# Patient Record
Sex: Male | Born: 1975 | Race: White | Hispanic: No | Marital: Single | Smoking: Current some day smoker
Health system: Southern US, Community
[De-identification: ages and names within clinical notes are randomized; demographics above are authoritative.]

## PROBLEM LIST (undated history)

## (undated) DIAGNOSIS — J449 Chronic obstructive pulmonary disease, unspecified: Secondary | ICD-10-CM

## (undated) DIAGNOSIS — I10 Essential (primary) hypertension: Secondary | ICD-10-CM

## (undated) DIAGNOSIS — I219 Acute myocardial infarction, unspecified: Secondary | ICD-10-CM

## (undated) HISTORY — DX: Chronic obstructive pulmonary disease, unspecified: J44.9

---

## 1999-02-19 ENCOUNTER — Emergency Department (HOSPITAL_COMMUNITY): Admission: EM | Admit: 1999-02-19 | Discharge: 1999-02-19 | Payer: Self-pay | Admitting: Emergency Medicine

## 1999-03-07 ENCOUNTER — Emergency Department (HOSPITAL_COMMUNITY): Admission: EM | Admit: 1999-03-07 | Discharge: 1999-03-07 | Payer: Self-pay | Admitting: Emergency Medicine

## 1999-06-26 ENCOUNTER — Emergency Department (HOSPITAL_COMMUNITY): Admission: EM | Admit: 1999-06-26 | Discharge: 1999-06-26 | Payer: Self-pay | Admitting: *Deleted

## 1999-08-03 ENCOUNTER — Emergency Department (HOSPITAL_COMMUNITY): Admission: EM | Admit: 1999-08-03 | Discharge: 1999-08-03 | Payer: Self-pay | Admitting: *Deleted

## 1999-10-17 ENCOUNTER — Emergency Department (HOSPITAL_COMMUNITY): Admission: EM | Admit: 1999-10-17 | Discharge: 1999-10-17 | Payer: Self-pay

## 1999-12-07 ENCOUNTER — Emergency Department (HOSPITAL_COMMUNITY): Admission: EM | Admit: 1999-12-07 | Discharge: 1999-12-07 | Payer: Self-pay | Admitting: *Deleted

## 2000-01-10 ENCOUNTER — Emergency Department (HOSPITAL_COMMUNITY): Admission: EM | Admit: 2000-01-10 | Discharge: 2000-01-10 | Payer: Self-pay | Admitting: Emergency Medicine

## 2000-08-12 ENCOUNTER — Emergency Department (HOSPITAL_COMMUNITY): Admission: EM | Admit: 2000-08-12 | Discharge: 2000-08-12 | Payer: Self-pay | Admitting: *Deleted

## 2000-11-14 ENCOUNTER — Emergency Department (HOSPITAL_COMMUNITY): Admission: EM | Admit: 2000-11-14 | Discharge: 2000-11-14 | Payer: Self-pay | Admitting: Internal Medicine

## 2001-01-26 ENCOUNTER — Emergency Department (HOSPITAL_COMMUNITY): Admission: EM | Admit: 2001-01-26 | Discharge: 2001-01-26 | Payer: Self-pay | Admitting: Emergency Medicine

## 2001-08-25 ENCOUNTER — Emergency Department (HOSPITAL_COMMUNITY): Admission: EM | Admit: 2001-08-25 | Discharge: 2001-08-25 | Payer: Self-pay | Admitting: Emergency Medicine

## 2001-11-17 ENCOUNTER — Emergency Department (HOSPITAL_COMMUNITY): Admission: EM | Admit: 2001-11-17 | Discharge: 2001-11-17 | Payer: Self-pay | Admitting: Emergency Medicine

## 2002-11-03 ENCOUNTER — Emergency Department (HOSPITAL_COMMUNITY): Admission: EM | Admit: 2002-11-03 | Discharge: 2002-11-03 | Payer: Self-pay | Admitting: Emergency Medicine

## 2002-11-03 ENCOUNTER — Encounter: Payer: Self-pay | Admitting: Emergency Medicine

## 2003-11-22 ENCOUNTER — Emergency Department (HOSPITAL_COMMUNITY): Admission: EM | Admit: 2003-11-22 | Discharge: 2003-11-22 | Payer: Self-pay | Admitting: Emergency Medicine

## 2005-08-19 ENCOUNTER — Emergency Department (HOSPITAL_COMMUNITY): Admission: EM | Admit: 2005-08-19 | Discharge: 2005-08-19 | Payer: Self-pay | Admitting: Emergency Medicine

## 2005-12-04 ENCOUNTER — Emergency Department (HOSPITAL_COMMUNITY): Admission: EM | Admit: 2005-12-04 | Discharge: 2005-12-04 | Payer: Self-pay | Admitting: Emergency Medicine

## 2006-12-28 ENCOUNTER — Emergency Department (HOSPITAL_COMMUNITY): Admission: EM | Admit: 2006-12-28 | Discharge: 2006-12-28 | Payer: Self-pay | Admitting: Emergency Medicine

## 2007-07-19 ENCOUNTER — Emergency Department (HOSPITAL_COMMUNITY): Admission: EM | Admit: 2007-07-19 | Discharge: 2007-07-19 | Payer: Self-pay | Admitting: Emergency Medicine

## 2007-07-23 ENCOUNTER — Emergency Department (HOSPITAL_COMMUNITY): Admission: EM | Admit: 2007-07-23 | Discharge: 2007-07-23 | Payer: Self-pay | Admitting: Emergency Medicine

## 2007-08-02 ENCOUNTER — Emergency Department (HOSPITAL_COMMUNITY): Admission: EM | Admit: 2007-08-02 | Discharge: 2007-08-02 | Payer: Self-pay | Admitting: Emergency Medicine

## 2008-07-26 ENCOUNTER — Emergency Department (HOSPITAL_COMMUNITY): Admission: EM | Admit: 2008-07-26 | Discharge: 2008-07-26 | Payer: Self-pay | Admitting: Emergency Medicine

## 2008-11-23 ENCOUNTER — Emergency Department (HOSPITAL_COMMUNITY): Admission: EM | Admit: 2008-11-23 | Discharge: 2008-11-23 | Payer: Self-pay | Admitting: Emergency Medicine

## 2009-09-15 ENCOUNTER — Emergency Department (HOSPITAL_COMMUNITY): Admission: EM | Admit: 2009-09-15 | Discharge: 2009-09-15 | Payer: Self-pay | Admitting: Emergency Medicine

## 2009-10-21 ENCOUNTER — Observation Stay (HOSPITAL_COMMUNITY): Admission: EM | Admit: 2009-10-21 | Discharge: 2009-10-21 | Payer: Self-pay | Admitting: Emergency Medicine

## 2010-09-08 LAB — POCT CARDIAC MARKERS
CKMB, poc: 1.4 ng/mL (ref 1.0–8.0)
CKMB, poc: <1 ng/mL — ABNORMAL LOW (ref 1.0–8.0)
Myoglobin, poc: 68.9 ng/mL (ref 12–200)
Troponin i, poc: <0.05 ng/mL (ref 0.00–0.09)

## 2010-09-08 LAB — BASIC METABOLIC PANEL
CO2: 24 mEq/L (ref 19–32)
Calcium: 8.5 mg/dL (ref 8.4–10.5)
Chloride: 108 mEq/L (ref 96–112)
GFR calc Af Amer: >60 mL/min (ref >60)
Sodium: 140 mEq/L (ref 135–145)

## 2010-09-08 LAB — DIFFERENTIAL
Basophils Relative: 0 % (ref 0–1)
Lymphs Abs: 3.8 10*3/uL (ref 0.7–4.0)
Monocytes Absolute: 0.4 10*3/uL (ref 0.1–1.0)
Monocytes Relative: 4 % (ref 3–12)
Neutro Abs: 6.4 10*3/uL (ref 1.7–7.7)

## 2010-09-08 LAB — CBC
Hemoglobin: 15.4 g/dL (ref 13.0–17.0)
MCHC: 34.2 g/dL (ref 30.0–36.0)
RBC: 4.74 MIL/uL (ref 4.22–5.81)
WBC: 10.8 10*3/uL — ABNORMAL HIGH (ref 4.0–10.5)

## 2010-12-31 ENCOUNTER — Emergency Department: Payer: Self-pay | Admitting: Emergency Medicine

## 2015-12-08 ENCOUNTER — Emergency Department
Admission: EM | Admit: 2015-12-08 | Discharge: 2015-12-08 | Disposition: A | Discharge status: Home / Self Care | Payer: Self-pay | Attending: Emergency Medicine | Admitting: Emergency Medicine

## 2015-12-08 ENCOUNTER — Encounter: Payer: Self-pay | Admitting: Emergency Medicine

## 2015-12-08 DIAGNOSIS — F17200 Nicotine dependence, unspecified, uncomplicated: Secondary | ICD-10-CM | POA: Insufficient documentation

## 2015-12-08 DIAGNOSIS — L723 Sebaceous cyst: Secondary | ICD-10-CM | POA: Insufficient documentation

## 2015-12-08 DIAGNOSIS — L089 Local infection of the skin and subcutaneous tissue, unspecified: Secondary | ICD-10-CM

## 2015-12-08 MED ORDER — SULFAMETHOXAZOLE-TRIMETHOPRIM 800-160 MG PO TABS: 1.0000 | ORAL_TABLET | Freq: Two times a day (BID) | ORAL | Status: DC

## 2015-12-08 NOTE — ED Notes (Signed)
See triage note  Developed some swelling behind left ear couple of days ago hx of abscess behind same ear.

## 2015-12-08 NOTE — ED Provider Notes (Signed)
[facility] Emergency Department Provider Note  ____________________________________________  Time seen: Approximately 8:39 AM  I have reviewed the triage vital signs and the nursing notes.   HISTORY  Chief Complaint Abscess    HPI Timothy Myers is a 40 y.o. male, NAD, presents to the emergency department with complaint of a nodule on the left neck. He first noticed a lesion in this area 2-3 years ago but states in the last two days it has increased in size and became sore last night. Describes pain as non-radiating soreness and rates at 1.5/10. Denies redness, warmth, fevers, nausea and vomiting. Alleviated pain with Goody's powder. Pain increases with palpation and movement of jaw. He reports a similar lesion behind his ear several years ago that required incision and drainage.    History reviewed. No pertinent past medical history.  There are no active problems to display for this patient.   History reviewed. No pertinent past surgical history.  Current Outpatient Rx  Name  Route  Sig  Dispense  Refill  . sulfamethoxazole-trimethoprim (BACTRIM DS,SEPTRA DS) 800-160 MG tablet   Oral   Take 1 tablet by mouth 2 (two) times daily.   14 tablet   0     Allergies Review of patient's allergies indicates no known allergies.  No family history on file.  Social History Social History  Substance Use Topics  . Smoking status: Current Some Day Smoker  . Smokeless tobacco: None  . Alcohol Use: No     Review of Systems  Constitutional: No fever/chills ENT: No sore throat, swelling of neck, difficulty swallowing. Cardiovascular: No chest pain. Respiratory: No cough. No shortness of breath. No wheezing.  Gastrointestinal: No abdominal pain.  No nausea, vomiting.  Musculoskeletal: Negative for neck pain.  Skin: Positive skin sore left neck and behind ears. Negative for rash, redness, oozing, weeping. Neurological: Negative for headaches, focal  weakness or numbness. No tingling.  10-point ROS otherwise negative.  ____________________________________________   PHYSICAL EXAM:  VITAL SIGNS: ED Triage Vitals  Enc Vitals Group     BP 12/08/15 0820 155/98 mmHg     Pulse Rate 12/08/15 0820 88     Resp 12/08/15 0820 18     Temp 12/08/15 0820 98 F (36.7 C)     Temp Source 12/08/15 0820 Oral     SpO2 12/08/15 0820 98 %     Weight 12/08/15 0820 200 lb (90.719 kg)     Height 12/08/15 0820 6\' 4"  (1.93 m)     Head Cir --      Peak Flow --      Pain Score --      Pain Loc --      Pain Edu? --      Excl. in GC? --      Constitutional: Alert and oriented. Well appearing and in no acute distress. Eyes: Conjunctivae are normal.  Head: Atraumatic. ENT:      Nose: No congestion/rhinnorhea.      Mouth/Throat: Mucous membranes are moist.  Neck: Supple with FROM.   Hematological/Lymphatic/Immunilogical: No cervical lymphadenopathy. Cardiovascular: Good peripheral circulation. Respiratory: Normal respiratory effort without tachypnea or retractions. Neurologic:  Normal speech and language. No gross focal neurologic deficits are appreciated.  Skin:  1.0x1.0cm subcutaneous nodule on L neck beneath ear without erythema or warmth. Ttenderness to palpation of nodule. No drainage or weeping of lesion. Surrounding skin is non-erythematous and intact. Multiple white pustules noted behind the ear that are firm and without tenderness to  palpation. Skin is warm, dry and intact. No rash noted. Psychiatric: Mood and affect are normal. Speech and behavior are normal. Patient exhibits appropriate insight and judgement.   ____________________________________________   LABS  None ____________________________________________  EKG  None ____________________________________________  RADIOLOGY  None ____________________________________________    PROCEDURES  Procedure(s) performed: None    Medications - No data to  display   ____________________________________________   INITIAL IMPRESSION / ASSESSMENT AND PLAN / ED COURSE  Patient's diagnosis is consistent with infected sebaceous cyst. Patient will be discharged home with prescriptions for Bactrim DS to take as directed. Advised warm compresses to the affected areas 20 minutes 3-4 times daily. May take over-the-counter Tylenol or ibuprofen as needed for pain. Patient is to follow up with dermatology if symptoms persist past this treatment course. Patient is given ED precautions to return to the ED for any worsening or new symptoms.    ____________________________________________  FINAL CLINICAL IMPRESSION(S) / ED DIAGNOSES  Final diagnoses:  Infected sebaceous cyst      NEW MEDICATIONS STARTED DURING THIS VISIT:  New Prescriptions   SULFAMETHOXAZOLE-TRIMETHOPRIM (BACTRIM DS,SEPTRA DS) 800-160 MG TABLET    Take 1 tablet by mouth 2 (two) times daily.        Hope Pigeon, PA-C 12/08/15 0858  Myrna Blazer, MD 12/08/15 1540

## 2015-12-08 NOTE — ED Notes (Signed)
Pt noted a nodule to the right side of neck. possilble abscess or swollen gland, states has been there for one year. Denies any other sx at this time.

## 2015-12-08 NOTE — Discharge Instructions (Signed)
Epidermal Cyst An epidermal cyst is sometimes called a sebaceous cyst, epidermal inclusion cyst, or infundibular cyst. These cysts usually contain a substance that looks "pasty" or "cheesy" and may have a bad smell. This substance is a protein called keratin. Epidermal cysts are usually found on the face, neck, or trunk. They may also occur in the vaginal area or other parts of the genitalia of both men and women. Epidermal cysts are usually small, painless, slow-growing bumps or lumps that move freely under the skin. It is important not to try to pop them. This may cause an infection and lead to tenderness and swelling. CAUSES  Epidermal cysts may be caused by a deep penetrating injury to the skin or a plugged hair follicle, often associated with acne. SYMPTOMS  Epidermal cysts can become inflamed and cause:  Redness.  Tenderness.  Increased temperature of the skin over the bumps or lumps.  Grayish-white, bad smelling material that drains from the bump or lump. DIAGNOSIS  Epidermal cysts are easily diagnosed by your caregiver during an exam. Rarely, a tissue sample (biopsy) may be taken to rule out other conditions that may resemble epidermal cysts. TREATMENT   Epidermal cysts often get better and disappear on their own. They are rarely ever cancerous.  If a cyst becomes infected, it may become inflamed and tender. This may require opening and draining the cyst. Treatment with antibiotics may be necessary. When the infection is gone, the cyst may be removed with minor surgery.  Small, inflamed cysts can often be treated with antibiotics or by injecting steroid medicines.  Sometimes, epidermal cysts become large and bothersome. If this happens, surgical removal in your caregiver's office may be necessary. HOME CARE INSTRUCTIONS  Only take over-the-counter or prescription medicines as directed by your caregiver.  Take your antibiotics as directed. Finish them even if you start to feel  better. SEEK MEDICAL CARE IF:   Your cyst becomes tender, red, or swollen.  Your condition is not improving or is getting worse.  You have any other questions or concerns. MAKE SURE YOU:  Understand these instructions.  Will watch your condition.  Will get help right away if you are not doing well or get worse.   This information is not intended to replace advice given to you by your health care provider. Make sure you discuss any questions you have with your health care provider.   Document Released: 05/08/2004 Document Revised: 08/30/2011 Document Reviewed: 12/14/2010 Elsevier Interactive Patient Education 2016 Elsevier Inc.  

## 2015-12-10 ENCOUNTER — Encounter (HOSPITAL_COMMUNITY): Payer: Self-pay | Admitting: Emergency Medicine

## 2015-12-10 ENCOUNTER — Emergency Department (HOSPITAL_COMMUNITY)
Admission: EM | Admit: 2015-12-10 | Discharge: 2015-12-10 | Disposition: A | Discharge status: Home / Self Care | Payer: Self-pay | Attending: Emergency Medicine | Admitting: Emergency Medicine

## 2015-12-10 DIAGNOSIS — H6002 Abscess of left external ear: Secondary | ICD-10-CM | POA: Insufficient documentation

## 2015-12-10 DIAGNOSIS — I10 Essential (primary) hypertension: Secondary | ICD-10-CM | POA: Insufficient documentation

## 2015-12-10 DIAGNOSIS — F17200 Nicotine dependence, unspecified, uncomplicated: Secondary | ICD-10-CM | POA: Insufficient documentation

## 2015-12-10 HISTORY — DX: Essential (primary) hypertension: I10

## 2015-12-10 MED ORDER — LIDOCAINE HCL 2 % IJ SOLN
5.0000 mL | Freq: Once | INTRAMUSCULAR | Status: AC
  Administered 2015-12-10: 100 mg via INTRADERMAL
  Filled 2015-12-10: qty 20

## 2015-12-10 NOTE — Discharge Instructions (Signed)
Warm compresses to the area. Follow up as needed.    Abscess An abscess is an infected area that contains a collection of pus and debris.It can occur in almost any part of the body. An abscess is also known as a furuncle or boil. CAUSES  An abscess occurs when tissue gets infected. This can occur from blockage of oil or sweat glands, infection of hair follicles, or a minor injury to the skin. As the body tries to fight the infection, pus collects in the area and creates pressure under the skin. This pressure causes pain. People with weakened immune systems have difficulty fighting infections and get certain abscesses more often.  SYMPTOMS Usually an abscess develops on the skin and becomes a painful mass that is red, warm, and tender. If the abscess forms under the skin, you may feel a moveable soft area under the skin. Some abscesses break open (rupture) on their own, but most will continue to get worse without care. The infection can spread deeper into the body and eventually into the bloodstream, causing you to feel ill.  DIAGNOSIS  Your caregiver will take your medical history and perform a physical exam. A sample of fluid may also be taken from the abscess to determine what is causing your infection. TREATMENT  Your caregiver may prescribe antibiotic medicines to fight the infection. However, taking antibiotics alone usually does not cure an abscess. Your caregiver may need to make a small cut (incision) in the abscess to drain the pus. In some cases, gauze is packed into the abscess to reduce pain and to continue draining the area. HOME CARE INSTRUCTIONS   Only take over-the-counter or prescription medicines for pain, discomfort, or fever as directed by your caregiver.  If you were prescribed antibiotics, take them as directed. Finish them even if you start to feel better.  If gauze is used, follow your caregiver's directions for changing the gauze.  To avoid spreading the  infection:  Keep your draining abscess covered with a bandage.  Wash your hands well.  Do not share personal care items, towels, or whirlpools with others.  Avoid skin contact with others.  Keep your skin and clothes clean around the abscess.  Keep all follow-up appointments as directed by your caregiver. SEEK MEDICAL CARE IF:   You have increased pain, swelling, redness, fluid drainage, or bleeding.  You have muscle aches, chills, or a general ill feeling.  You have a fever. MAKE SURE YOU:   Understand these instructions.  Will watch your condition.  Will get help right away if you are not doing well or get worse.   This information is not intended to replace advice given to you by your health care provider. Make sure you discuss any questions you have with your health care provider.   Document Released: 03/17/2005 Document Revised: 12/07/2011 Document Reviewed: 08/20/2011 Elsevier Interactive Patient Education [employer] Elsevier Inc.

## 2015-12-10 NOTE — ED Notes (Addendum)
PA at bedside to I & D patient abscess.

## 2015-12-10 NOTE — ED Provider Notes (Signed)
CSN: [id number]     Arrival date & time 12/10/15  1318 History  By signing my name below, I, Rosario Adie, attest that this documentation has been prepared under the direction and in the presence of Kalden Wanke, PA-C.   Electronically Signed: Rosario Adie, ED Scribe. 12/10/2015. 2:26 PM.   Chief Complaint  Patient presents with  . Abscess   The history is provided by the patient. No language interpreter was used.   HPI Comments: Timothy Myers is a 40 y.o. male who presents to the Emergency Department complaining of a gradual onset, gradually worsening, constant abscess behind his left ear onset 4 days PTA. Pt was seen in the ED 2 days ago for his symptoms, and was prescribed Bactrim BID, which he has been complaint in taking, and has had no relief of his symptoms. He has associated pain that is worsened by palpation and jaw movement. He has a hx of a similar abscess to the same area which were resolved with I&D several years ago. Pt denies fever. He is not complaining of any other symptoms at this time.   Past Medical History  Diagnosis Date  . Hypertension    History reviewed. No pertinent past surgical history. No family history on file. Social History  Substance Use Topics  . Smoking status: Current Some Day Smoker  . Smokeless tobacco: None  . Alcohol Use: No    Review of Systems  Constitutional: Negative for fever.  Skin:       +abcess behind left ear   Allergies  Review of patient's allergies indicates no known allergies.  Home Medications   Prior to Admission medications   Medication Sig Start Date End Date Taking? Authorizing Provider  sulfamethoxazole-trimethoprim (BACTRIM DS,SEPTRA DS) 800-160 MG tablet Take 1 tablet by mouth 2 (two) times daily. 12/08/15   Jami L Hagler, PA-C   BP 157/98 mmHg  Pulse 84  Temp(Src) 98 F (36.7 C) (Oral)  Resp 16  Ht  (1.905 m)  Wt 200 lb (90.719 kg)  BMI 25.00 kg/m2  SpO2 96%   Physical Exam   Constitutional: He appears well-developed and well-nourished.  HENT:  Head: Normocephalic.  Eyes: Conjunctivae are normal.  Cardiovascular: Normal rate.   Pulmonary/Chest: Effort normal. No respiratory distress.  Abdominal: He exhibits no distension.  Musculoskeletal: Normal range of motion.  Neurological: He is alert.  Skin: Skin is warm and dry.  3 x 1 cm abscess just posterior to the left ear. Tender to palpation. Erythematous. Fluctuant.  Psychiatric: He has a normal mood and affect. His behavior is normal.  Nursing note and vitals reviewed.  ED Course  Procedures (including critical care time)  DIAGNOSTIC STUDIES: Oxygen Saturation is 96% on RA, normal by my interpretation.   COORDINATION OF CARE: 2:13 PM-Discussed next steps with pt including I&D. Pt verbalized understanding and is agreeable with the plan.   INCISION AND DRAINAGE PROCEDURE NOTE: Patient identification was confirmed and verbal consent was obtained. This procedure was performed by Jaynie Crumble, PA-C at 2:34 PM. Site: posterior left ear Sterile procedures observed Needle size: 25  Anesthetic used (type and amt): 2% lidocaine, 1cc Blade size: 11 Drainage: large, purulent  Complexity: Complex Packing was not used Site anesthetized, incision made over site, wound drained and explored loculations, rinsed with copious amounts of normal saline, wound packed with sterile gauze, covered with dry, sterile dressing.  Pt tolerated procedure well without complications.  Instructions for care discussed verbally and pt provided with  additional written instructions for homecare and f/u.  MDM   Final diagnoses:  Abscess of left external ear   Patient with a skin abscess to the posterior left ear. Incised and drained in emergency department with large purulent drainage. Patient is currently on antibiotics, will continue those, follow-up as needed. Wound care at home. Patient is otherwise nontoxic  appearing.  Filed Vitals:   12/10/15 1330 12/10/15 1331  BP:  157/98  Pulse: 84   Temp: 98 F (36.7 C)   TempSrc: Oral   Resp: 16   Height: 6\' 3"  (1.905 m)   Weight: 90.719 kg   SpO2: 96%     I personally performed the services described in this documentation, which was scribed in my presence. The recorded information has been reviewed and is accurate.    Jaynie Crumbleatyana Vonnetta Akey, PA-C 12/10/15 1618  Rolland PorterMark James, MD 12/18/15 [phone]

## 2015-12-10 NOTE — ED Notes (Signed)
Pt has abscess behind left ear with swelling under left ear. Pt has been taking bactrim since Monday with no help.

## 2017-01-21 ENCOUNTER — Inpatient Hospital Stay
Admission: EM | Admit: 2017-01-21 | Discharge: 2017-01-23 | DRG: 247 | Disposition: A | Discharge status: Home / Self Care | Payer: Self-pay | Attending: Internal Medicine | Admitting: Internal Medicine

## 2017-01-21 ENCOUNTER — Encounter: Payer: Self-pay | Admitting: Emergency Medicine

## 2017-01-21 ENCOUNTER — Encounter
Admission: EM | Disposition: A | Discharge status: Home / Self Care | Payer: Self-pay | Source: Home / Self Care | Attending: Internal Medicine

## 2017-01-21 DIAGNOSIS — I2119 ST elevation (STEMI) myocardial infarction involving other coronary artery of inferior wall: Principal | ICD-10-CM | POA: Diagnosis present

## 2017-01-21 DIAGNOSIS — I2111 ST elevation (STEMI) myocardial infarction involving right coronary artery: Secondary | ICD-10-CM | POA: Diagnosis present

## 2017-01-21 DIAGNOSIS — I249 Acute ischemic heart disease, unspecified: Secondary | ICD-10-CM

## 2017-01-21 DIAGNOSIS — F17210 Nicotine dependence, cigarettes, uncomplicated: Secondary | ICD-10-CM | POA: Diagnosis present

## 2017-01-21 DIAGNOSIS — E876 Hypokalemia: Secondary | ICD-10-CM | POA: Diagnosis present

## 2017-01-21 DIAGNOSIS — Z880 Allergy status to penicillin: Secondary | ICD-10-CM

## 2017-01-21 DIAGNOSIS — F1410 Cocaine abuse, uncomplicated: Secondary | ICD-10-CM | POA: Diagnosis present

## 2017-01-21 DIAGNOSIS — I213 ST elevation (STEMI) myocardial infarction of unspecified site: Secondary | ICD-10-CM

## 2017-01-21 DIAGNOSIS — E785 Hyperlipidemia, unspecified: Secondary | ICD-10-CM | POA: Diagnosis present

## 2017-01-21 DIAGNOSIS — F1110 Opioid abuse, uncomplicated: Secondary | ICD-10-CM | POA: Diagnosis present

## 2017-01-21 DIAGNOSIS — I2121 ST elevation (STEMI) myocardial infarction involving left circumflex coronary artery: Secondary | ICD-10-CM | POA: Diagnosis present

## 2017-01-21 DIAGNOSIS — Z8249 Family history of ischemic heart disease and other diseases of the circulatory system: Secondary | ICD-10-CM

## 2017-01-21 DIAGNOSIS — I10 Essential (primary) hypertension: Secondary | ICD-10-CM | POA: Diagnosis present

## 2017-01-21 DIAGNOSIS — F1210 Cannabis abuse, uncomplicated: Secondary | ICD-10-CM | POA: Diagnosis present

## 2017-01-21 HISTORY — PX: CORONARY/GRAFT ACUTE MI REVASCULARIZATION: CATH118305

## 2017-01-21 HISTORY — PX: LEFT HEART CATH AND CORONARY ANGIOGRAPHY: CATH118249

## 2017-01-21 LAB — COMPREHENSIVE METABOLIC PANEL
ALT: 17 U/L (ref 17–63)
AST: 29 U/L (ref 15–41)
Albumin: 4 g/dL (ref 3.5–5.0)
Alkaline Phosphatase: 115 U/L (ref 38–126)
Anion gap: 13 (ref 5–15)
BUN: 17 mg/dL (ref 6–20)
CO2: 24 mmol/L (ref 22–32)
Calcium: 9.5 mg/dL (ref 8.9–10.3)
Chloride: 103 mmol/L (ref 101–111)
Creatinine, Ser: 0.97 mg/dL (ref 0.61–1.24)
GFR calc Af Amer: >60 mL/min (ref >60)
Glucose, Bld: 125 mg/dL — ABNORMAL HIGH (ref 65–99)
POTASSIUM: 3.3 mmol/L — AB (ref 3.5–5.1)
Sodium: 140 mmol/L (ref 135–145)
Total Bilirubin: 0.7 mg/dL (ref 0.3–1.2)
Total Protein: 7.6 g/dL (ref 6.5–8.1)

## 2017-01-21 LAB — BASIC METABOLIC PANEL WITH GFR
Anion gap: 8 (ref 5–15)
BUN: 12 mg/dL (ref 6–20)
CO2: 23 mmol/L (ref 22–32)
Calcium: 8.9 mg/dL (ref 8.9–10.3)
Chloride: 107 mmol/L (ref 101–111)
Creatinine, Ser: 0.87 mg/dL (ref 0.61–1.24)
GFR calc Af Amer: >60 mL/min
GFR calc non Af Amer: >60 mL/min
Glucose, Bld: 122 mg/dL — ABNORMAL HIGH (ref 65–99)
Potassium: 3.9 mmol/L (ref 3.5–5.1)
Sodium: 138 mmol/L (ref 135–145)

## 2017-01-21 LAB — TROPONIN I: TROPONIN I: 10.38 ng/mL — AB (ref <0.03)

## 2017-01-21 LAB — CBC WITH DIFFERENTIAL/PLATELET
BASOS ABS: 0.2 10*3/uL — AB (ref 0–0.1)
BASOS PCT: 1 %
Eosinophils Absolute: 0.6 10*3/uL (ref 0–0.7)
Eosinophils Relative: 4 %
HEMATOCRIT: 46.9 % (ref 40.0–52.0)
HEMOGLOBIN: 16.3 g/dL (ref 13.0–18.0)
Lymphocytes Relative: 27 %
Lymphs Abs: 4 10*3/uL — ABNORMAL HIGH (ref 1.0–3.6)
MCH: 31.4 pg (ref 26.0–34.0)
MCHC: 34.7 g/dL (ref 32.0–36.0)
MCV: 90.3 fL (ref 80.0–100.0)
MONO ABS: 1.3 10*3/uL — AB (ref 0.2–1.0)
Monocytes Relative: 9 %
NEUTROS ABS: 9 10*3/uL — AB (ref 1.4–6.5)
NEUTROS PCT: 59 %
Platelets: 239 10*3/uL (ref 150–440)
RBC: 5.19 MIL/uL (ref 4.40–5.90)
RDW: 14.1 % (ref 11.5–14.5)
WBC: 15.1 10*3/uL — ABNORMAL HIGH (ref 3.8–10.6)

## 2017-01-21 LAB — LIPID PANEL
CHOL/HDL RATIO: 7.4 ratio
CHOLESTEROL: 223 mg/dL — AB (ref 0–200)
HDL: 30 mg/dL — ABNORMAL LOW (ref >40)
LDL Cholesterol: 151 mg/dL — ABNORMAL HIGH (ref 0–99)
Triglycerides: 208 mg/dL — ABNORMAL HIGH (ref <150)
VLDL: 42 mg/dL — ABNORMAL HIGH (ref 0–40)

## 2017-01-21 LAB — PROTIME-INR
INR: 1.01
Prothrombin Time: 13.3 seconds (ref 11.4–15.2)

## 2017-01-21 LAB — PHOSPHORUS: Phosphorus: 3.6 mg/dL (ref 2.5–4.6)

## 2017-01-21 LAB — MAGNESIUM: Magnesium: 1.9 mg/dL (ref 1.7–2.4)

## 2017-01-21 LAB — CARDIAC CATHETERIZATION: CATHEFQUANT: 50 %

## 2017-01-21 LAB — APTT: aPTT: 33 seconds (ref 24–36)

## 2017-01-21 LAB — GLUCOSE, CAPILLARY: GLUCOSE-CAPILLARY: 134 mg/dL — AB (ref 65–99)

## 2017-01-21 LAB — MRSA PCR SCREENING: MRSA by PCR: NEGATIVE

## 2017-01-21 LAB — POCT ACTIVATED CLOTTING TIME: Activated Clotting Time: 318 seconds

## 2017-01-21 SURGERY — LEFT HEART CATH AND CORONARY ANGIOGRAPHY
Anesthesia: Moderate Sedation

## 2017-01-21 MED ORDER — METOPROLOL TARTRATE 25 MG PO TABS
25.0000 mg | ORAL_TABLET | Freq: Two times a day (BID) | ORAL | Status: DC
Start: 2017-01-21 — End: 2017-01-21
  Administered 2017-01-21: 25 mg via ORAL
  Filled 2017-01-21: qty 1

## 2017-01-21 MED ORDER — ONDANSETRON HCL 4 MG/2ML IJ SOLN
INTRAMUSCULAR | Status: AC
  Administered 2017-01-21: 4 mg
  Filled 2017-01-21: qty 2

## 2017-01-21 MED ORDER — LIDOCAINE HCL (PF) 1 % IJ SOLN
INTRAMUSCULAR | Status: AC
Start: 2017-01-21 — End: 2017-01-21
  Filled 2017-01-21: qty 30

## 2017-01-21 MED ORDER — TICAGRELOR 90 MG PO TABS
90.0000 mg | ORAL_TABLET | Freq: Two times a day (BID) | ORAL | Status: DC
  Administered 2017-01-21 – 2017-01-23 (×4): 90 mg via ORAL
  Filled 2017-01-21 (×5): qty 1

## 2017-01-21 MED ORDER — BIVALIRUDIN TRIFLUOROACETATE 250 MG IV SOLR
INTRAVENOUS | Status: AC
  Filled 2017-01-21: qty 250

## 2017-01-21 MED ORDER — SODIUM CHLORIDE 0.9 % IV SOLN: 0.2500 mg/kg/h | INTRAVENOUS | Status: AC

## 2017-01-21 MED ORDER — MORPHINE SULFATE (PF) 4 MG/ML IV SOLN
INTRAVENOUS | Status: AC
  Administered 2017-01-21: 4 mg
  Filled 2017-01-21: qty 1

## 2017-01-21 MED ORDER — ASPIRIN 81 MG PO CHEW
81.0000 mg | CHEWABLE_TABLET | Freq: Every day | ORAL | Status: DC
  Administered 2017-01-22 – 2017-01-23 (×2): 81 mg via ORAL
  Filled 2017-01-21 (×2): qty 1

## 2017-01-21 MED ORDER — ONDANSETRON HCL 4 MG/2ML IJ SOLN
INTRAMUSCULAR | Status: AC
  Filled 2017-01-21: qty 2

## 2017-01-21 MED ORDER — SODIUM CHLORIDE 0.9 % IV SOLN: 250.0000 mL | INTRAVENOUS | Status: DC | PRN

## 2017-01-21 MED ORDER — SODIUM CHLORIDE 0.9 % IV SOLN
INTRAVENOUS | Status: AC | PRN
  Administered 2017-01-21: 1.75 mg/kg/h via INTRAVENOUS

## 2017-01-21 MED ORDER — SODIUM CHLORIDE 0.9 % IV SOLN
INTRAVENOUS | Status: AC | PRN
  Administered 2017-01-21: 0.25 mg/kg/h via INTRAVENOUS

## 2017-01-21 MED ORDER — TICAGRELOR 90 MG PO TABS
ORAL_TABLET | ORAL | Status: AC
  Filled 2017-01-21: qty 1

## 2017-01-21 MED ORDER — MORPHINE SULFATE (PF) 2 MG/ML IV SOLN: 2.0000 mg | INTRAVENOUS | Status: DC | PRN

## 2017-01-21 MED ORDER — IOPAMIDOL (ISOVUE-300) INJECTION 61%
INTRAVENOUS | Status: DC | PRN
  Administered 2017-01-21: 230 mL via INTRA_ARTERIAL

## 2017-01-21 MED ORDER — HYDRALAZINE HCL 20 MG/ML IJ SOLN: 5.0000 mg | INTRAMUSCULAR | Status: AC | PRN

## 2017-01-21 MED ORDER — METOPROLOL TARTRATE 25 MG PO TABS
25.0000 mg | ORAL_TABLET | Freq: Three times a day (TID) | ORAL | Status: DC
Start: 2017-01-21 — End: 2017-01-23
  Administered 2017-01-22 – 2017-01-23 (×4): 25 mg via ORAL
  Filled 2017-01-21 (×4): qty 1

## 2017-01-21 MED ORDER — SODIUM CHLORIDE 0.9 % WEIGHT BASED INFUSION
1.0000 mL/kg/h | INTRAVENOUS | Status: DC
  Administered 2017-01-21 (×2): 1 mL/kg/h via INTRAVENOUS

## 2017-01-21 MED ORDER — LABETALOL HCL 5 MG/ML IV SOLN: 10.0000 mg | INTRAVENOUS | Status: AC | PRN

## 2017-01-21 MED ORDER — NITROGLYCERIN 1 MG/10 ML FOR IR/CATH LAB
INTRA_ARTERIAL | Status: DC | PRN
  Administered 2017-01-21: 200 ug

## 2017-01-21 MED ORDER — ATORVASTATIN CALCIUM 20 MG PO TABS
80.0000 mg | ORAL_TABLET | Freq: Every day | ORAL | Status: DC
  Administered 2017-01-21 – 2017-01-22 (×2): 80 mg via ORAL
  Filled 2017-01-21 (×2): qty 4

## 2017-01-21 MED ORDER — NITROGLYCERIN 5 MG/ML IV SOLN
INTRAVENOUS | Status: AC
  Filled 2017-01-21: qty 10

## 2017-01-21 MED ORDER — TICAGRELOR 90 MG PO TABS
ORAL_TABLET | ORAL | Status: DC | PRN
  Administered 2017-01-21: 180 mg via ORAL

## 2017-01-21 MED ORDER — OXYCODONE-ACETAMINOPHEN 5-325 MG PO TABS
1.0000 | ORAL_TABLET | ORAL | Status: DC | PRN
  Administered 2017-01-21 – 2017-01-22 (×2): 2 via ORAL
  Filled 2017-01-21 (×2): qty 2

## 2017-01-21 MED ORDER — MIDAZOLAM HCL 2 MG/2ML IJ SOLN
INTRAMUSCULAR | Status: AC
  Filled 2017-01-21: qty 2

## 2017-01-21 MED ORDER — FENTANYL CITRATE (PF) 100 MCG/2ML IJ SOLN
INTRAMUSCULAR | Status: DC | PRN
  Administered 2017-01-21 (×2): 50 ug via INTRAVENOUS

## 2017-01-21 MED ORDER — ACETAMINOPHEN 325 MG PO TABS
650.0000 mg | ORAL_TABLET | ORAL | Status: DC | PRN
  Administered 2017-01-21: 650 mg via ORAL
  Filled 2017-01-21: qty 2

## 2017-01-21 MED ORDER — HEPARIN SODIUM (PORCINE) 5000 UNIT/ML IJ SOLN
4000.0000 [IU] | Freq: Once | INTRAMUSCULAR | Status: AC
  Administered 2017-01-21: 4000 [IU] via INTRAVENOUS

## 2017-01-21 MED ORDER — BIVALIRUDIN BOLUS VIA INFUSION - CUPID
INTRAVENOUS | Status: DC | PRN
  Administered 2017-01-21: 72 mg via INTRAVENOUS

## 2017-01-21 MED ORDER — SODIUM CHLORIDE 0.9% FLUSH
3.0000 mL | Freq: Two times a day (BID) | INTRAVENOUS | Status: DC
  Administered 2017-01-22 (×3): 3 mL via INTRAVENOUS

## 2017-01-21 MED ORDER — NICOTINE 21 MG/24HR TD PT24
21.0000 mg | MEDICATED_PATCH | Freq: Every day | TRANSDERMAL | Status: DC
  Administered 2017-01-21: 21 mg via TRANSDERMAL
  Filled 2017-01-21: qty 1

## 2017-01-21 MED ORDER — SODIUM CHLORIDE 0.9% FLUSH: 3.0000 mL | INTRAVENOUS | Status: DC | PRN

## 2017-01-21 MED ORDER — LISINOPRIL 5 MG PO TABS
2.5000 mg | ORAL_TABLET | Freq: Every day | ORAL | Status: DC
  Administered 2017-01-21 – 2017-01-23 (×3): 2.5 mg via ORAL
  Filled 2017-01-21 (×3): qty 1

## 2017-01-21 MED ORDER — ONDANSETRON HCL 4 MG/2ML IJ SOLN
4.0000 mg | Freq: Four times a day (QID) | INTRAMUSCULAR | Status: DC | PRN
  Administered 2017-01-22: 4 mg via INTRAVENOUS
  Filled 2017-01-21: qty 2

## 2017-01-21 MED ORDER — FENTANYL CITRATE (PF) 100 MCG/2ML IJ SOLN
INTRAMUSCULAR | Status: AC
  Filled 2017-01-21: qty 2

## 2017-01-21 MED ORDER — ASPIRIN 81 MG PO CHEW: 324.0000 mg | CHEWABLE_TABLET | Freq: Once | ORAL | Status: DC

## 2017-01-21 MED ORDER — MIDAZOLAM HCL 2 MG/2ML IJ SOLN
INTRAMUSCULAR | Status: DC | PRN
  Administered 2017-01-21 (×2): 1 mg via INTRAVENOUS

## 2017-01-21 SURGICAL SUPPLY — 18 items
BALLN TREK RX 3.0X20 (BALLOONS) ×3
BALLOON TREK RX 3.0X20 (BALLOONS) IMPLANT
CATH 5FR JL4 DIAGNOSTIC (CATHETERS) ×2 IMPLANT
CATH INFINITI 5FR ANG PIGTAIL (CATHETERS) ×2 IMPLANT
CATH VISTA GUIDE 6FR JR4 (CATHETERS) ×2 IMPLANT
DEVICE CLOSURE MYNXGRIP 6/7F (Vascular Products) ×2 IMPLANT
DEVICE INFLAT 30 PLUS (MISCELLANEOUS) ×2 IMPLANT
KIT MANI 3VAL PERCEP (MISCELLANEOUS) ×3 IMPLANT
NDL PERC 18GX7CM (NEEDLE) IMPLANT
NEEDLE PERC 18GX7CM (NEEDLE) ×3 IMPLANT
PACK CARDIAC CATH (CUSTOM PROCEDURE TRAY) ×3 IMPLANT
SHEATH AVANTI 6FR X 11CM (SHEATH) ×2 IMPLANT
STENT XIENCE ALPINE RX 3.5X28 (Permanent Stent) ×2 IMPLANT
WIRE ASAHI GRAND SLAM 180CM (WIRE) ×3 IMPLANT
WIRE EMERALD 3MM-J .035X150CM (WIRE) ×2 IMPLANT
WIRE G HI TQ BMW 190 (WIRE) ×2 IMPLANT
WIRE HI TORQ WHISPER MS 190CM (WIRE) ×4 IMPLANT
WIRE INTUITION PROPEL ST 180CM (WIRE) ×2 IMPLANT

## 2017-01-21 NOTE — Progress Notes (Signed)
CH responded to Code Stemi for pt in ED02. Upon arrival, pt had not arrived. Pt arrived a few minutes later. Pt was alert and talking. Dc's and nurses examined the pt before moving him to Cath Lab for procedure. No family member was present. Pt indicated he was going to call his family. Champlain to follow up with pt as needed to provide moral support and spiritual care.    01/21/17 1400  Clinical Encounter Type  Visited With Patient;Health care provider  Referral From Nurse  Consult/Referral To Chaplain  Spiritual Encounters  Spiritual Needs Other (Comment)

## 2017-01-21 NOTE — ED Provider Notes (Signed)
[facility] Emergency Department Provider Note  ____________________________________________  Time seen: Approximately 1:11 PM  I have reviewed the triage vital signs and the nursing notes.   HISTORY  Chief Complaint Code STEMI   HPI Timothy Myers is a 41 y.o. male a history of hypertension and smoking who presents for evaluation of chest pain. Patient reports he was watching TV when he developed sudden onset of severe chest pressure located in the center of his chest, constant and nonradiating. He broke out in a sweat. No nausea or vomiting. No shortness of breath. Patient smokes a pack of day. No family history of ischemic heart disease. Patient does not take any medications for his hypertension. Currently endorsing out of 10 pain. Patient drove himself to the fire department. Per EMS patient was given a full dose of aspirin, 3 sublingual nitroglycerin, and fentanyl. Patient denies paresthesias of his extremities or radiation of the pain to his back or abdomen.EKG transmitted by EMS which led to cath lab activation in the field.   Past Medical History:  Diagnosis Date  . Hypertension     There are no active problems to display for this patient.   History reviewed. No pertinent surgical history.  Prior to Admission medications   Medication Sig Start Date End Date Taking? Authorizing Provider  sulfamethoxazole-trimethoprim (BACTRIM DS,SEPTRA DS) 800-160 MG tablet Take 1 tablet by mouth 2 (two) times daily. 12/08/15   Hagler, Jami L, PA-C    Allergies Patient has no known allergies.  No family history on file.  Social History Social History  Substance Use Topics  . Smoking status: Current Some Day Smoker  . Smokeless tobacco: Never Used  . Alcohol use No    Review of Systems  Constitutional: Negative for fever. Eyes: Negative for visual changes. ENT: Negative for sore throat. Neck: No neck pain  Cardiovascular: + chest pain and  diaphoresis Respiratory: Negative for shortness of breath. Gastrointestinal: Negative for abdominal pain, vomiting or diarrhea. Genitourinary: Negative for dysuria. Musculoskeletal: Negative for back pain. Skin: Negative for rash. Neurological: Negative for headaches, weakness or numbness. Psych: No SI or HI  ____________________________________________   PHYSICAL EXAM:  VITAL SIGNS: ED Triage Vitals  Enc Vitals Group     BP 01/21/17 1307 (!) 156/103     Pulse Rate 01/21/17 1305 68     Resp 01/21/17 1305 16     Temp --      Temp Source 01/21/17 1305 Oral     SpO2 01/21/17 1305 100 %     Weight 01/21/17 1300 213 lb 13.5 oz (97 kg)     Height --      Head Circumference --      Peak Flow --      Pain Score 01/21/17 1307 8     Pain Loc --      Pain Edu? --      Excl. in GC? --     Constitutional: Alert and oriented. Well appearing and in no apparent distress. HEENT:      Head: Normocephalic and atraumatic.         Eyes: Conjunctivae are normal. Sclera is non-icteric.       Mouth/Throat: Mucous membranes are moist.       Neck: Supple with no signs of meningismus. Cardiovascular: Regular rate and rhythm. No murmurs, gallops, or rubs. 2+ symmetrical distal pulses are present in all extremities. No JVD. Respiratory: Normal respiratory effort. Lungs are clear to auscultation bilaterally. No wheezes, crackles,  or rhonchi.  Gastrointestinal: Soft, non tender, and non distended with positive bowel sounds. No rebound or guarding. Musculoskeletal: Nontender with normal range of motion in all extremities. No edema, cyanosis, or erythema of extremities. Neurologic: Normal speech and language. Face is symmetric. Moving all extremities. No gross focal neurologic deficits are appreciated. Skin: Skin is warm, dry and intact. No rash noted. Psychiatric: Mood and affect are normal. Speech and behavior are normal.  ____________________________________________   LABS (all labs ordered are  listed, but only abnormal results are displayed)  Labs Reviewed  CBC WITH DIFFERENTIAL/PLATELET  PROTIME-INR  APTT  COMPREHENSIVE METABOLIC PANEL  TROPONIN I  LIPID PANEL   ____________________________________________  EKG  ED ECG REPORT I, Nita Sicklearolina Jamyson Jirak, the attending physician, personally viewed and interpreted this ECG.  Normal sinus rhythm, rate of 63, normal intervals, normal axis, ST elevation inferior leads with reciprocal changes concerning for STEMI ____________________________________________  RADIOLOGY  none  ____________________________________________   PROCEDURES  Procedure(s) performed: None Procedures Critical Care performed:  Yes  CRITICAL CARE Performed by: Nita Sicklearolina Demaris Leavell  ?  Total critical care time: 30 min  Critical care time was exclusive of separately billable procedures and treating other patients.  Critical care was necessary to treat or prevent imminent or life-threatening deterioration.  Critical care was time spent personally by me on the following activities: development of treatment plan with patient and/or surrogate as well as nursing, discussions with consultants, evaluation of patient's response to treatment, examination of patient, obtaining history from patient or surrogate, ordering and performing treatments and interventions, ordering and review of laboratory studies, ordering and review of radiographic studies, pulse oximetry and re-evaluation of patient's condition.  ____________________________________________   INITIAL IMPRESSION / ASSESSMENT AND PLAN / ED COURSE  41 y.o. male a history of hypertension and smoking who presents for evaluation of chest pain. Code STEMI activated. The patient received aspirin per EMS. 8 out of 10 pain. Nitroglycerin was held due to concerns of inferior MI possible RV involvement. Patient was given morphine and Zofran for his symptoms. Was given a bolus of heparin. Patient evaluated by Dr.  Juliann Paresallwood on call for cath lab and taken to the cath lab in stable conditions.     Pertinent labs & imaging results that were available during my care of the patient were reviewed by me and considered in my medical decision making (see chart for details).    ____________________________________________   FINAL CLINICAL IMPRESSION(S) / ED DIAGNOSES  Final diagnoses:  ST elevation myocardial infarction (STEMI), unspecified artery (HCC)      NEW MEDICATIONS STARTED DURING THIS VISIT:  Current Discharge Medication List       Note:  This document was prepared using Dragon voice recognition software and may include unintentional dictation errors.    Don PerkingVeronese, [place], MD 01/21/17 [phone]

## 2017-01-21 NOTE — Progress Notes (Signed)
CH responded to a consult for AD. CH met with pt and his 2 friends who were at bedside. Pt appeared calm and reflective. [place] educated pt about the AD, but pt requested for time to review and would call chaplain when ready to complete AD. Maunie to do a follow up visit with pt as needed.   01/21/17 1800  Clinical Encounter Type  Visited With Patient;Other (Comment)  Visit Type Follow-up  Referral From Nurse  Consult/Referral To Chaplain  Spiritual Encounters  Spiritual Needs Brochure;Other (Comment)

## 2017-01-21 NOTE — Consult Note (Signed)
PULMONARY / CRITICAL CARE MEDICINE   Name: Timothy Myers MRN: [id number] DOB: [birth year]    ADMISSION DATE:  01/21/2017   CONSULTATION DATE:  01/22/2017  REFERRING MD: Dr. Juliann Paresallwood  CHIEF COMPLAINT:  Chest pain with EKG changes  HISTORY OF PRESENT ILLNESS:   This is a 41 y/o WM with a PMH as indicated below presented to the ED with complaints of sudden onset chest pain. His EKG showed inferior wall ST elevation MI and he was emergently taken to the Cath Lab for left heart catheterization. He was found to have a 100% stenosis in the distal RCA which was stented. He also had residual stenosis in the mid LAD and  A LVEF of 45-50%. He is currently post cath and reports mild chest discomfort and heartburn which he describes as same feeling he had prior to ED presentation. He denies dizziness, palpitations, nausea and vomiting.  PAST MEDICAL HISTORY :  He  has a past medical history of Hypertension.  PAST SURGICAL HISTORY: He  has no past surgical history on file.  Allergies  Allergen Reactions  . Penicillins Anaphylaxis    No current facility-administered medications on file prior to encounter.    Current Outpatient Prescriptions on File Prior to Encounter  Medication Sig  . sulfamethoxazole-trimethoprim (BACTRIM DS,SEPTRA DS) 800-160 MG tablet Take 1 tablet by mouth 2 (two) times daily.    FAMILY HISTORY:  His indicated that his mother is alive. He indicated that his father is deceased.    SOCIAL HISTORY: He  reports that he has been smoking Cigarettes.  He has a 45.00 pack-year smoking history. He has never used smokeless tobacco. He reports that he uses drugs, including Marijuana. He reports that he does not drink alcohol.  REVIEW OF SYSTEMS:   Constitutional: Negative for fever and chills.  HENT: Negative for congestion and rhinorrhea.  Eyes: Negative for redness and visual disturbance.  Respiratory: Negative for shortness of breath and wheezing.  Cardiovascular:  Negative for chest pain and palpitations.  Gastrointestinal: Negative  for nausea , vomiting but positive for epigastric pain Genitourinary: Negative for dysuria and urgency.  Endocrine: Denies polyuria, polyphagia and heat intolerance Musculoskeletal: Negative for myalgias and arthralgias.  Skin: Negative for pallor and wound.  Neurological: Negative for dizziness and headaches   SUBJECTIVE:   VITAL SIGNS: BP 140/84   Pulse 64   Temp 97.7 F (36.5 C) (Oral)   Resp 11   Ht 6\' 3"  (1.905 m)   Wt 211 lb 10.3 oz (96 kg)   SpO2 95%   BMI 26.45 kg/m   HEMODYNAMICS:    VENTILATOR SETTINGS:    INTAKE / OUTPUT: I/O last 3 completed shifts: In: 401.3 [I.V.:401.3] Out: -   PHYSICAL EXAMINATION: General:  No acute distress Neuro:  Alert and oriented 4, no focal deficits HEENT:  PERRLA, trachea midline, no JVD Cardiovascular:  Apical pulse regular, bradycardic at 52 bpm, S1, S2, no murmur, regurg or gallop, +2 pulses, no edema Lungs:  To auscultation bilaterally Abdomen:  Soft, nontender, nondistended, normal bowel sounds in all 4 quadrants Musculoskeletal:  No joint deformities Skin:  Warm and dry  LABS:  BMET  Recent Labs Lab 01/21/17 1258  NA 140  K 3.3*  CL 103  CO2 24  BUN 17  CREATININE 0.97  GLUCOSE 125*    Electrolytes  Recent Labs Lab 01/21/17 1258  CALCIUM 9.5    CBC  Recent Labs Lab 01/21/17 1258  WBC 15.1*  HGB 16.3  HCT 46.9  PLT 239    Coag's  Recent Labs Lab 01/21/17 1258  APTT 33  INR 1.01    Sepsis Markers No results for input(s): LATICACIDVEN, PROCALCITON, O2SATVEN in the last 168 hours.  ABG No results for input(s): PHART, PCO2ART, PO2ART in the last 168 hours.  Liver Enzymes  Recent Labs Lab 01/21/17 1258  AST 29  ALT 17  ALKPHOS 115  BILITOT 0.7  ALBUMIN 4.0    Cardiac Enzymes  Recent Labs Lab 01/21/17 1258 01/21/17 1517  TROPONINI <0.03 10.38*    Glucose  Recent Labs Lab 01/21/17 1452   GLUCAP 134*    Imaging No results found.   STUDIES:  LHC 01/21/17   A STENT XIENCE ALPINE RX [id number] drug eluting stent was successfully placed, and does not overlap previously placed stent.  Dist RCA lesion, 100 %stenosed.  Post intervention, there is a 0% residual stenosis.  Mid LAD lesion, 50 %stenosed.  Mid LAD to Dist LAD lesion, 10 %stenosed.  There is mild left ventricular systolic dysfunction.  LV end diastolic pressure is normal.  The left ventricular ejection fraction is 45-50% by visual estimate    CULTURES: None  ANTIBIOTICS: None  SIGNIFICANT EVENTS: 01/21/2017: Admitted with ST elevation MI  LINES/TUBES: Peripheral IVs  DISCUSSION: 41 year old male with a significant family history of heart disease, hypertension and current tobacco use who presents with an acute ST elevation MI; now status post left heart catheterization with stent to the RCA.  ASSESSMENT  ST elevation MI, status posts left heart catheterization with stent to the RCA History of hypertension Tobacco use disorder  PLAN Cardiology following Aspirin, statin, beta blocker,ACEI, and Brilinta per cardiology Hemodynamics per ICU protocol Sublingual nitroglycerin as needed for chest pain. Morphine as needed for chest pain. Urine toxicology stat GI and DVT prophylaxis Further changes in treatment plan pending clinical course and diagnostics FAMILY  - Updates: Patient updated on current treatment plan  - Inter-disciplinary family meet or Palliative Care meeting due by:  day 7    [facility] ANP-BC Pulmonary and Critical Care Medicine [facility] Pager [phone] or [phone]  01/21/2017, 8:30 PM  PCCM ATTENDING ATTESTATION: I have evaluated patient with the APP [facility]ov, reviewed database in its entirety and discussed care plan in detail.   Has order for transfer to telemetry. PCCM will sign off     Billy Fischeravid Rosine Solecki, MD PCCM service Mobile  [phone] Pager [phone] 01/22/2017 8:45 AM

## 2017-01-21 NOTE — Consult Note (Signed)
Reason for Consult: STEMI inferior wall Referring Physician: [facility] emergency room Dr. Mattie Marlin Timothy Myers is an 41 y.o. male.  HPI: Patient's a 41 year old white male smoker presented with acute chest pain symptoms while at rest at home watching television. Patient states about an hour prior to presentation he had acute onset of mid chest discomfort shortness of breath and acute diaphoresis he waited about 10 minutes and got in his car and drove to the fire department at [facility]. Patient was brought in by rescue squad after his initial EKG suggested STEMI inferiorly. Patient took about 25 minutes to arrive to the emergency room [place] and the [place]. Patient appeared to be hemodynamically stable no previous cardiac history and after evaluation he was advised to undergo a more emergent cardiac catheter with intention to treat for possible intervention.  Past Medical History:  Diagnosis Date  . Hypertension     History reviewed. No pertinent surgical history.  No family history on file.  Social History:  reports that he has been smoking.  He has never used smokeless tobacco. He reports that he does not drink alcohol. His drug history is not on file.  Allergies: No Known Allergies  Medications: I have reviewed the patient's current medications.  Results for orders placed or performed during the hospital encounter of 01/21/17 (from the past 48 hour(s))  CBC with Differential/Platelet     Status: Abnormal   Collection Time: 01/21/17 12:58 PM  Result Value Ref Range   WBC 15.1 (H) 3.8 - 10.6 K/uL   RBC 5.19 4.40 - 5.90 MIL/uL   Hemoglobin 16.3 13.0 - 18.0 g/dL   HCT 46.9 40.0 - 52.0 %   MCV 90.3 80.0 - 100.0 fL   MCH 31.4 26.0 - 34.0 pg   MCHC 34.7 32.0 - 36.0 g/dL   RDW 14.1 11.5 - 14.5 %   Platelets 239 150 - 440 K/uL   Neutrophils Relative % 59 %   Neutro Abs 9.0 (H) 1.4 - 6.5 K/uL   Lymphocytes Relative 27 %   Lymphs Abs 4.0 (H) 1.0 - 3.6 K/uL   Monocytes Relative 9  %   Monocytes Absolute 1.3 (H) 0.2 - 1.0 K/uL   Eosinophils Relative 4 %   Eosinophils Absolute 0.6 0 - 0.7 K/uL   Basophils Relative 1 %   Basophils Absolute 0.2 (H) 0 - 0.1 K/uL  Protime-INR     Status: None   Collection Time: 01/21/17 12:58 PM  Result Value Ref Range   Prothrombin Time 13.3 11.4 - 15.2 seconds   INR 1.01   APTT     Status: None   Collection Time: 01/21/17 12:58 PM  Result Value Ref Range   aPTT 33 24 - 36 seconds  Comprehensive metabolic panel     Status: Abnormal   Collection Time: 01/21/17 12:58 PM  Result Value Ref Range   Sodium 140 135 - 145 mmol/L   Potassium 3.3 (L) 3.5 - 5.1 mmol/L   Chloride 103 101 - 111 mmol/L   CO2 24 22 - 32 mmol/L   Glucose, Bld 125 (H) 65 - 99 mg/dL   BUN 17 6 - 20 mg/dL   Creatinine, Ser 0.97 0.61 - 1.24 mg/dL   Calcium 9.5 8.9 - 10.3 mg/dL   Total Protein 7.6 6.5 - 8.1 g/dL   Albumin 4.0 3.5 - 5.0 g/dL   AST 29 15 - 41 U/L   ALT 17 17 - 63 U/L   Alkaline Phosphatase  115 38 - 126 U/L   Total Bilirubin 0.7 0.3 - 1.2 mg/dL   GFR calc non Af Amer >60 >60 mL/min   GFR calc Af Amer >60 >60 mL/min    Comment: (NOTE) The eGFR has been calculated using the CKD EPI equation. This calculation has not been validated in all clinical situations. eGFR's persistently <60 mL/min signify possible Chronic Kidney Disease.    Anion gap 13 5 - 15  Troponin I     Status: None   Collection Time: 01/21/17 12:58 PM  Result Value Ref Range   Troponin I <0.03 <0.03 ng/mL  Lipid panel     Status: Abnormal   Collection Time: 01/21/17 12:58 PM  Result Value Ref Range   Cholesterol 223 (H) 0 - 200 mg/dL   Triglycerides 208 (H) <150 mg/dL   HDL 30 (L) >40 mg/dL   Total CHOL/HDL Ratio 7.4 RATIO   VLDL 42 (H) 0 - 40 mg/dL   LDL Cholesterol 151 (H) 0 - 99 mg/dL    Comment:        Total Cholesterol/HDL:CHD Risk Coronary Heart Disease Risk Table                     Men   Women  1/2 Average Risk   3.4   3.3  Average Risk       5.0   4.4  2 X  Average Risk   9.6   7.1  3 X Average Risk  23.4   11.0        Use the calculated Patient Ratio above and the CHD Risk Table to determine the patient's CHD Risk.        ATP III CLASSIFICATION (LDL):  <100     mg/dL   Optimal  100-129  mg/dL   Near or Above                    Optimal  130-159  mg/dL   Borderline  160-189  mg/dL   High  >190     mg/dL   Very High   POCT Activated clotting time     Status: None   Collection Time: 01/21/17  1:57 PM  Result Value Ref Range   Activated Clotting Time 318 seconds    No results found.  Review of Systems  All other systems reviewed and are negative.  Blood pressure (!) 156/103, pulse 68, resp. rate 16, weight 96 kg (211 lb 10.3 oz), SpO2 99 %. Physical Exam  Nursing note and vitals reviewed. Constitutional: He is oriented to person, place, and time. He appears well-developed and well-nourished.  HENT:  Head: Normocephalic and atraumatic.  Eyes: Pupils are equal, round, and reactive to light. Conjunctivae and EOM are normal.  Neck: Normal range of motion. Neck supple.  Cardiovascular: Normal rate, regular rhythm and normal heart sounds.   Respiratory: Effort normal and breath sounds normal.  GI: Soft. Bowel sounds are normal.  Musculoskeletal: Normal range of motion.  Neurological: He is alert and oriented to person, place, and time. He has normal reflexes.  Skin: Skin is warm and dry.  Psychiatric: He has a normal mood and affect.    Assessment/Plan: STEMI inferior wall Abnormal EKG Smoking Hyperlipidemia Hypertension Hypokalemia we will correct electrolytes . PLAN Status post intervention artery catheter PCI and stent to distal RCA Continue aspirin Brilinta Consider low-dose beta blocker ACE inhibitor Recommend statin therapy with Lipitor 80 mg once a day We'll prescribe NicoDerm patch  21 mg a help from tobacco abuse Consult hospitalist for medical management Patient will be referred to cardiac  rehabilitation   Barbette Mcglaun D Anjolaoluwa Siguenza 01/21/2017, 2:33 PM

## 2017-01-21 NOTE — Progress Notes (Signed)
Called Dr. Juliann Paresallwood to inform that patient has a troponin >65. BP high, HR 70. Wants lopressor started at 25mg  tid with first dose now.

## 2017-01-21 NOTE — H&P (Signed)
[facility] [place] at [facility]   PATIENT NAME: Timothy Myers Crafton    MR#:  [id number]  DATE OF BIRTH:  Dec 03, 1975  DATE OF ADMISSION:  01/21/2017  PRIMARY CARE PHYSICIAN: Patient, No Pcp Per   REQUESTING/REFERRING PHYSICIAN: Dr. Dorothyann Pengwayne Callwood.  CHIEF COMPLAINT:   Chief Complaint  Patient presents with  . Code STEMI    HISTORY OF PRESENT ILLNESS:  Timothy Myers Lex  is a 41 y.o. male with No Past medical history presents to the hospital due to chest pain and noted to have EKG changes consistent with ST elevation MI inferiorly. Patient was in his usual state of health when this morning he developed some chest pain while watching television. He thought it was indigestion and took some Zantac but did not alleviate his symptoms, but aggressively got worse and then radiated to his jaw and to his left arm. He also has associated diaphoresis, nausea and dizziness and therefore came to the ER for further evaluation. Patient was noted to have ST elevations in his inferior leads and urgently rushed to the cardiac catheterization lab. Patient underwent a drug-eluting stent to his RCA which was 100% occluded. Post cardiac catheterization patient is chest pain-free and hemodynamically stable. Patient denies any other associated symptoms presently.  PAST MEDICAL HISTORY:   Past Medical History:  Diagnosis Date  . Hypertension     PAST SURGICAL HISTORY:  History reviewed. No pertinent surgical history.  SOCIAL HISTORY:   Social History  Substance Use Topics  . Smoking status: Current Some Day Smoker    Packs/day: 1.50    Years: 30.00    Types: Cigarettes  . Smokeless tobacco: Never Used  . Alcohol use No    FAMILY HISTORY:  No family history on file. No family hx of DM, HTN, or CAD.   DRUG ALLERGIES:   Allergies  Allergen Reactions  . Penicillins Anaphylaxis    REVIEW OF SYSTEMS:   Review of Systems  Constitutional: Negative for fever and weight loss.   HENT: Negative for congestion, nosebleeds and tinnitus.   Eyes: Negative for blurred vision, double vision and redness.  Respiratory: Negative for cough, hemoptysis and shortness of breath.   Cardiovascular: Negative for chest pain, orthopnea, leg swelling and PND.  Gastrointestinal: Negative for abdominal pain, diarrhea, melena, nausea and vomiting.  Genitourinary: Negative for dysuria, hematuria and urgency.  Musculoskeletal: Negative for falls and joint pain.  Neurological: Negative for dizziness, tingling, sensory change, focal weakness, seizures, weakness and headaches.  Endo/Heme/Allergies: Negative for polydipsia. Does not bruise/bleed easily.  Psychiatric/Behavioral: Negative for depression and memory loss. The patient is not nervous/anxious.     MEDICATIONS AT HOME:   Prior to Admission medications   Medication Sig Start Date End Date Taking? Authorizing Provider  sulfamethoxazole-trimethoprim (BACTRIM DS,SEPTRA DS) 800-160 MG tablet Take 1 tablet by mouth 2 (two) times daily. 12/08/15   Hagler, Jami L, PA-C      VITAL SIGNS:  Blood pressure (!) 141/101, pulse 65, temperature 97.7 F (36.5 C), temperature source Oral, resp. rate 14, weight 96 kg (211 lb 10.3 oz), SpO2 91 %.  PHYSICAL EXAMINATION:  Physical Exam  GENERAL:  41 y.o.-year-old patient lying in the bed in no acute distress.  EYES: Pupils equal, round, reactive to light and accommodation. No scleral icterus. Extraocular muscles intact.  HEENT: Head atraumatic, normocephalic. Oropharynx and nasopharynx clear. No oropharyngeal erythema, moist oral mucosa  NECK:  Supple, no jugular venous distention. No thyroid enlargement, no tenderness.  LUNGS: Normal breath sounds  bilaterally, no wheezing, rales, rhonchi. No use of accessory muscles of respiration.  CARDIOVASCULAR: S1, S2 RRR. No murmurs, rubs, gallops, clicks.  ABDOMEN: Soft, nontender, nondistended. Bowel sounds present. No organomegaly or mass.  EXTREMITIES:  No pedal edema, cyanosis, or clubbing. + 2 pedal & radial pulses b/l.   NEUROLOGIC: Cranial nerves II through XII are intact. No focal Motor or sensory deficits appreciated b/l PSYCHIATRIC: The patient is alert and oriented x 3.  SKIN: No obvious rash, lesion, or ulcer.   LABORATORY PANEL:   CBC  Recent Labs Lab 01/21/17 1258  WBC 15.1*  HGB 16.3  HCT 46.9  PLT 239   ------------------------------------------------------------------------------------------------------------------  Chemistries   Recent Labs Lab 01/21/17 1258  NA 140  K 3.3*  CL 103  CO2 24  GLUCOSE 125*  BUN 17  CREATININE 0.97  CALCIUM 9.5  AST 29  ALT 17  ALKPHOS 115  BILITOT 0.7   ------------------------------------------------------------------------------------------------------------------  Cardiac Enzymes  Recent Labs Lab 01/21/17 1517  TROPONINI 10.38*   ------------------------------------------------------------------------------------------------------------------  RADIOLOGY:  No results found.   IMPRESSION AND PLAN:   41 year old male with no significant past medical history of tobacco abuse who presents to the hospital with chest pain and noted to have an ST elevation MI.  1. ST elevation MI-this is the cause of patient's chest pain on admission. -Patient was noted to have EKG changes consistent with a ST elevation MI in his inferior leads. Patient is status post cardiac catheterization with drug-eluting stent to his posterior RCA. Post cardiac catheterization patient is chronic chest pain-free and hemodynamically stable. -Continue aspirin, brillinta, atorvastatin, we'll start him on low-dose metoprolol, lisinopril.  2. Essential hypertension-started on low-dose metoprolol and lisinopril. Follow hemodynamics.  3. Hyperlipidemia-patient's total cholesterol is over 200. -Continue atorvastatin.  4. Tobacco abuse-continue nicotine patch.     All the records are reviewed  and case discussed with ED provider. Management plans discussed with the patient, family and they are in agreement.  CODE STATUS: Full code  TOTAL TIME TAKING CARE OF THIS PATIENT: 45 minutes.    [place] SirenSAINANI,VIVEK J M.D on 01/21/2017 at 5:22 PM  Between 7am to 6pm - Pager - [phone]  After 6pm go to [email or web address] - password [email or web address]  [facility] [place] [facility]  Office  [phone]  CC: Primary care physician; Patient, No Pcp Per

## 2017-01-21 NOTE — ED Triage Notes (Signed)
Pt to ED via [facility] from home for chest pain, 12 lead EKG showing inferior STEMI. Pt was given 324 mg of aspirin by EMS and a total of Fentanyl 100 mcg. Pt c/o mid-sternal chest pain that started about 45 minutes PTA. Pt states that he had diaphoresis and shortness of breath, denies N/V.

## 2017-01-22 LAB — CBC
HCT: 42.9 % (ref 40.0–52.0)
Hemoglobin: 14.8 g/dL (ref 13.0–18.0)
MCH: 31 pg (ref 26.0–34.0)
MCHC: 34.6 g/dL (ref 32.0–36.0)
MCV: 89.8 fL (ref 80.0–100.0)
PLATELETS: 196 10*3/uL (ref 150–440)
RBC: 4.78 MIL/uL (ref 4.40–5.90)
RDW: 13.9 % (ref 11.5–14.5)
WBC: 16.5 10*3/uL — AB (ref 3.8–10.6)

## 2017-01-22 LAB — URINE DRUG SCREEN, QUALITATIVE (ARMC ONLY)
Amphetamines, Ur Screen: NOT DETECTED
BARBITURATES, UR SCREEN: NOT DETECTED
Benzodiazepine, Ur Scrn: POSITIVE — AB
CANNABINOID 50 NG, UR ~~LOC~~: POSITIVE — AB
Cocaine Metabolite,Ur ~~LOC~~: NOT DETECTED
MDMA (Ecstasy)Ur Screen: NOT DETECTED
Methadone Scn, Ur: NOT DETECTED
OPIATE, UR SCREEN: POSITIVE — AB
PHENCYCLIDINE (PCP) UR S: NOT DETECTED
Tricyclic, Ur Screen: NOT DETECTED

## 2017-01-22 LAB — TROPONIN I: TROPONIN I: 56.11 ng/mL — AB (ref <0.03)

## 2017-01-22 LAB — BASIC METABOLIC PANEL
ANION GAP: 7 (ref 5–15)
BUN: 11 mg/dL (ref 6–20)
CALCIUM: 8.9 mg/dL (ref 8.9–10.3)
CO2: 26 mmol/L (ref 22–32)
Chloride: 105 mmol/L (ref 101–111)
Creatinine, Ser: 0.75 mg/dL (ref 0.61–1.24)
GFR calc non Af Amer: >60 mL/min (ref >60)
Glucose, Bld: 123 mg/dL — ABNORMAL HIGH (ref 65–99)
POTASSIUM: 3.7 mmol/L (ref 3.5–5.1)
SODIUM: 138 mmol/L (ref 135–145)

## 2017-01-22 LAB — PHOSPHORUS: PHOSPHORUS: 3 mg/dL (ref 2.5–4.6)

## 2017-01-22 LAB — MAGNESIUM: Magnesium: 1.8 mg/dL (ref 1.7–2.4)

## 2017-01-22 MED ORDER — DIPHENHYDRAMINE HCL 25 MG PO CAPS
25.0000 mg | ORAL_CAPSULE | Freq: Once | ORAL | Status: AC
  Administered 2017-01-23: 25 mg via ORAL
  Filled 2017-01-22: qty 1

## 2017-01-22 MED ORDER — DIPHENHYDRAMINE HCL 25 MG PO CAPS
25.0000 mg | ORAL_CAPSULE | Freq: Every evening | ORAL | Status: DC | PRN
  Administered 2017-01-22: 25 mg via ORAL
  Filled 2017-01-22 (×4): qty 1

## 2017-01-22 MED ORDER — NITROGLYCERIN 0.4 MG SL SUBL
SUBLINGUAL_TABLET | SUBLINGUAL | Status: AC
  Filled 2017-01-22: qty 1

## 2017-01-22 MED ORDER — NICOTINE 21 MG/24HR TD PT24
21.0000 mg | MEDICATED_PATCH | Freq: Every day | TRANSDERMAL | Status: DC
  Administered 2017-01-22 – 2017-01-23 (×2): 21 mg via TRANSDERMAL
  Filled 2017-01-22 (×3): qty 1

## 2017-01-22 MED ORDER — NITROGLYCERIN 0.4 MG SL SUBL
0.4000 mg | SUBLINGUAL_TABLET | SUBLINGUAL | Status: DC | PRN
  Administered 2017-01-22: 0.4 mg via SUBLINGUAL

## 2017-01-22 NOTE — Progress Notes (Signed)
Pt accepted in transfer from [facility]. He is a/o, painfree. Rt groin dressing clean dry and intact. Oriented to room and contact #'s for staff.

## 2017-01-22 NOTE — Progress Notes (Signed)
Patient requested a second dose of benadryl. He was unable to sleep after initial dose. Dr.Pyreddy gave orders for a one time dose.

## 2017-01-22 NOTE — Progress Notes (Addendum)
[facility] - [place] at [facility]   PATIENT NAME: Colan NeptuneClinton Dudzik    MR#:  [id number]  DATE OF BIRTH:  26-Jan-1976  SUBJECTIVE:  Feels better  REVIEW OF SYSTEMS:   Review of Systems  Constitutional: Negative for chills, fever and weight loss.  HENT: Negative for ear discharge, ear pain and nosebleeds.   Eyes: Negative for blurred vision, pain and discharge.  Respiratory: Negative for sputum production, shortness of breath, wheezing and stridor.   Cardiovascular: Negative for chest pain, palpitations, orthopnea and PND.  Gastrointestinal: Negative for abdominal pain, diarrhea, nausea and vomiting.  Genitourinary: Negative for frequency and urgency.  Musculoskeletal: Negative for back pain and joint pain.  Neurological: Negative for sensory change, speech change, focal weakness and weakness.  Psychiatric/Behavioral: Negative for depression and hallucinations. The patient is not nervous/anxious.    Tolerating Diet:yes Tolerating PT: not needed  DRUG ALLERGIES:   Allergies  Allergen Reactions  . Penicillins Anaphylaxis    VITALS:  Blood pressure (!) 140/93, pulse 73, temperature 98.3 F (36.8 C), temperature source Oral, resp. rate 20, height 6\' 3"  (1.905 m), weight 96 kg (211 lb 10.3 oz), SpO2 100 %.  PHYSICAL EXAMINATION:   Physical Exam  GENERAL:  41 y.o.-year-old patient lying in the bed with no acute distress.  EYES: Pupils equal, round, reactive to light and accommodation. No scleral icterus. Extraocular muscles intact.  HEENT: Head atraumatic, normocephalic. Oropharynx and nasopharynx clear.  NECK:  Supple, no jugular venous distention. No thyroid enlargement, no tenderness.  LUNGS: Normal breath sounds bilaterally, no wheezing, rales, rhonchi. No use of accessory muscles of respiration.  CARDIOVASCULAR: S1, S2 normal. No murmurs, rubs, or gallops.  ABDOMEN: Soft, nontender, nondistended. Bowel sounds present. No organomegaly or mass.   EXTREMITIES: No cyanosis, clubbing or edema b/l.    NEUROLOGIC: Cranial nerves II through XII are intact. No focal Motor or sensory deficits b/l.   PSYCHIATRIC:  patient is alert and oriented x 3.  SKIN: No obvious rash, lesion, or ulcer.   LABORATORY PANEL:  CBC  Recent Labs Lab 01/22/17 0227  WBC 16.5*  HGB 14.8  HCT 42.9  PLT 196    Chemistries   Recent Labs Lab 01/21/17 1258  01/22/17 0227  NA 140  < > 138  K 3.3*  < > 3.7  CL 103  < > 105  CO2 24  < > 26  GLUCOSE 125*  < > 123*  BUN 17  < > 11  CREATININE 0.97  < > 0.75  CALCIUM 9.5  < > 8.9  MG  --   < > 1.8  AST 29  --   --   ALT 17  --   --   ALKPHOS 115  --   --   BILITOT 0.7  --   --   < > = values in this interval not displayed. Cardiac Enzymes  Recent Labs Lab 01/22/17 0227  TROPONINI 56.11*   RADIOLOGY:  No results found. ASSESSMENT AND PLAN:  [age] year old male with no significant past medical history of tobacco abuse who presents to the hospital with chest pain and noted to have an ST elevation MI.  1. ACUTE ST elevation MI-this is the cause of patient's chest pain on admission. -Patient was noted to have EKG changes consistent with a ST elevation MI in his inferior leads. Patient is status post cardiac catheterization with drug-eluting stent to his posterior RCA. Post cardiac catheterization patient is chronic chest pain-free and hemodynamically stable. -  Continue aspirin, brillinta, atorvastatin, metoprolol, lisinopril.  2. Essential hypertension-started on low-dose metoprolol and lisinopril. Follow hemodynamics.  3. Hyperlipidemia-patient's total cholesterol is over 200. -Continue atorvastatin.  4. Tobacco abuse-continue nicotine patch.  5. Polysubstance drug abuse. Patient's urine drug screen was positive for cocaine and marijuana and opiates. He is advised on abstinence.  Transfer to telemetry. If remains stable discharge home tomorrow. Care management for medication  help.   Case discussed with Care Management/Social Worker. Management plans discussed with the patient, family and they are in agreement.  CODE STATUS: FULL  DVT Prophylaxis: Lovneox  TOTAL TIME TAKING CARE OF THIS PATIENT: *30* minutes.  >50% time spent on counselling and coordination of care  POSSIBLE D/C IN *1-2* DAYS, DEPENDING ON CLINICAL CONDITION.  Note: This dictation was prepared with Dragon dictation along with smaller phrase technology. Any transcriptional errors that result from this process are unintentional.  Kashira Behunin M.D on 01/22/2017 at 12:17 PM  Between 7am to 6pm - Pager - [phone]  After 6pm go to [email or web address] - password [email or web address]  [facility] [place] [facility]  Office  [phone]  CC: Primary care physician; Patient, No Pcp Per

## 2017-01-22 NOTE — Progress Notes (Signed)
Pt had several short runs of VT and frequent PVCs. Was able to sleep once benadryl administered. Denied chest pain, only had 1 episode of nausea and heartburn that was relieved with Zosyn and 1 nitro tab. EKG done twice. 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

## 2017-01-22 NOTE — Progress Notes (Signed)
Patient told NP when she rounded that he had heartburn and was burping. Inquired with patient why he did not report to nurse when in room, and patient stated it just started but it's the same feeling he had earlier in the day that caused him to get checked. Pt received 1 sublingual nitro and reported it passed. Declined second nitro tablet. Will continue to monitor.

## 2017-01-23 MED ORDER — TICAGRELOR 90 MG PO TABS: 90.0000 mg | ORAL_TABLET | Freq: Two times a day (BID) | ORAL | 2 refills | Status: DC

## 2017-01-23 MED ORDER — NICOTINE 21 MG/24HR TD PT24: 21.0000 mg | MEDICATED_PATCH | Freq: Every day | TRANSDERMAL | 0 refills | Status: DC

## 2017-01-23 MED ORDER — NITROGLYCERIN 0.4 MG SL SUBL: 0.4000 mg | SUBLINGUAL_TABLET | SUBLINGUAL | 12 refills | Status: DC | PRN

## 2017-01-23 MED ORDER — ASPIRIN 81 MG PO CHEW: 81.0000 mg | CHEWABLE_TABLET | Freq: Every day | ORAL | 2 refills | Status: DC

## 2017-01-23 MED ORDER — LISINOPRIL 2.5 MG PO TABS: 2.5000 mg | ORAL_TABLET | Freq: Every day | ORAL | 2 refills | Status: DC

## 2017-01-23 MED ORDER — ATORVASTATIN CALCIUM 80 MG PO TABS: 80.0000 mg | ORAL_TABLET | Freq: Every day | ORAL | 1 refills | Status: DC

## 2017-01-23 MED ORDER — METOPROLOL TARTRATE 25 MG PO TABS: 25.0000 mg | ORAL_TABLET | Freq: Two times a day (BID) | ORAL | 2 refills | Status: DC

## 2017-01-23 NOTE — Discharge Summary (Signed)
[facility] [place] [facility]   PATIENT NAME: Timothy NeptuneClinton Myers    MR#:  [id number]  DATE OF BIRTH:  03/13/1976  DATE OF ADMISSION:  01/21/2017 ADMITTING PHYSICIAN: Alwyn Peawayne D Callwood, MD  DATE OF DISCHARGE: 01/23/17  PRIMARY CARE PHYSICIAN: Patient, No Pcp Per    ADMISSION DIAGNOSIS:  STEMI involving right coronary artery (HCC) [I21.11]  DISCHARGE DIAGNOSIS:  Acute Inferior wall MI] Polysubstance abuse Tobacco use SECONDARY DIAGNOSIS:   Past Medical History:  Diagnosis Date  . Hypertension     HOSPITAL COURSE:  41 year old male with no significant past medical history of tobacco abuse who presents to the hospital with chest pain and noted to have an ST elevation MI.  1. ACUTE inferior wall ST elevation MI-this is the cause of patient's chest pain on admission. -Patient was noted to have EKG changes consistent with a ST elevation MI in his inferior leads. Patient is status post cardiac catheterization with drug-eluting stent to his posterior RCA. Post cardiac catheterization patient is  chest pain-free and hemodynamically stable. -Continue aspirin, brillinta, atorvastatin, metoprolol, lisinopril.  2. Essential hypertension-started on low-dose metoprolol and lisinopril.   3. Hyperlipidemia-patient's total cholesterol is over 200. -Continue atorvastatin.  4. Tobacco abuse-continue nicotine patch.advised smoking cessation >4 mins spent  5. Polysubstance drug abuse. Patient's urine drug screen was positive for cocaine and marijuana and opiates. He is advised on abstinence.  Care management for medication help provided  D/c home  CONSULTS OBTAINED:  Treatment Team:  Ramonita LabGouru, Aruna, MD Enedina FinnerPatel, Veola Cafaro, MD  DRUG ALLERGIES:   Allergies  Allergen Reactions  . Penicillins Anaphylaxis    DISCHARGE MEDICATIONS:   Current Discharge Medication List    START taking these medications   Details  aspirin 81 MG chewable tablet Chew 1 tablet (81  mg total) by mouth daily. Qty: 90 tablet, Refills: 2    atorvastatin (LIPITOR) 80 MG tablet Take 1 tablet (80 mg total) by mouth daily at 6 PM. Qty: 90 tablet, Refills: 1    lisinopril (PRINIVIL,ZESTRIL) 2.5 MG tablet Take 1 tablet (2.5 mg total) by mouth daily. Qty: 90 tablet, Refills: 2    metoprolol tartrate (LOPRESSOR) 25 MG tablet Take 1 tablet (25 mg total) by mouth 2 (two) times daily. Qty: 180 tablet, Refills: 2    nicotine (NICODERM CQ - DOSED IN MG/24 HOURS) 21 mg/24hr patch Place 1 patch (21 mg total) onto the skin daily. Qty: 28 patch, Refills: 0    nitroGLYCERIN (NITROSTAT) 0.4 MG SL tablet Place 1 tablet (0.4 mg total) under the tongue every 5 (five) minutes as needed for chest pain. Qty: 20 tablet, Refills: 12    ticagrelor (BRILINTA) 90 MG TABS tablet Take 1 tablet (90 mg total) by mouth 2 (two) times daily. Qty: 180 tablet, Refills: 2      CONTINUE these medications which have NOT CHANGED   Details  sulfamethoxazole-trimethoprim (BACTRIM DS,SEPTRA DS) 800-160 MG tablet Take 1 tablet by mouth 2 (two) times daily. Qty: 14 tablet, Refills: 0        If you experience worsening of your admission symptoms, develop shortness of breath, life threatening emergency, suicidal or homicidal thoughts you must seek medical attention immediately by calling 911 or calling your MD immediately  if symptoms less severe.  You Must read complete instructions/literature along with all the possible adverse reactions/side effects for all the Medicines you take and that have been prescribed to you. Take any new Medicines after you have completely understood and accept all  the possible adverse reactions/side effects.   Please note  You were cared for by a hospitalist during your hospital stay. If you have any questions about your discharge medications or the care you received while you were in the hospital after you are discharged, you can call the unit and asked to speak with the  hospitalist on call if the hospitalist that took care of you is not available. Once you are discharged, your primary care physician will handle any further medical issues. Please note that NO REFILLS for any discharge medications will be authorized once you are discharged, as it is imperative that you return to your primary care physician (or establish a relationship with a primary care physician if you do not have one) for your aftercare needs so that they can reassess your need for medications and monitor your lab values. Today   SUBJECTIVE   Doing well  VITAL SIGNS:  Blood pressure 125/84, pulse 69, temperature 98.4 F (36.9 C), temperature source Oral, resp. rate 18, height 6\' 3"  (1.905 m), weight 96 kg (211 lb 10.3 oz), SpO2 97 %.  I/O:   Intake/Output Summary (Last 24 hours) at 01/23/17 0903 Last data filed at 01/23/17 [id number]  Gross per 24 hour  Intake                0 ml  Output              600 ml  Net             -600 ml    PHYSICAL EXAMINATION:  GENERAL:  41 y.o.-year-old patient lying in the bed with no acute distress.  EYES: Pupils equal, round, reactive to light and accommodation. No scleral icterus. Extraocular muscles intact.  HEENT: Head atraumatic, normocephalic. Oropharynx and nasopharynx clear.  NECK:  Supple, no jugular venous distention. No thyroid enlargement, no tenderness.  LUNGS: Normal breath sounds bilaterally, no wheezing, rales,rhonchi or crepitation. No use of accessory muscles of respiration.  CARDIOVASCULAR: S1, S2 normal. No murmurs, rubs, or gallops.  ABDOMEN: Soft, non-tender, non-distended. Bowel sounds present. No organomegaly or mass.  EXTREMITIES: No pedal edema, cyanosis, or clubbing.  NEUROLOGIC: Cranial nerves II through XII are intact. Muscle strength 5/5 in all extremities. Sensation intact. Gait not checked.  PSYCHIATRIC: The patient is alert and oriented x 3.  SKIN: No obvious rash, lesion, or ulcer.   DATA REVIEW:   CBC   Recent  Labs Lab 01/22/17 0227  WBC 16.5*  HGB 14.8  HCT 42.9  PLT 196    Chemistries   Recent Labs Lab 01/21/17 1258  01/22/17 0227  NA 140  < > 138  K 3.3*  < > 3.7  CL 103  < > 105  CO2 24  < > 26  GLUCOSE 125*  < > 123*  BUN 17  < > 11  CREATININE 0.97  < > 0.75  CALCIUM 9.5  < > 8.9  MG  --   < > 1.8  AST 29  --   --   ALT 17  --   --   ALKPHOS 115  --   --   BILITOT 0.7  --   --   < > = values in this interval not displayed.  Microbiology Results   Recent Results (from the past 240 hour(s))  MRSA PCR Screening     Status: None   Collection Time: 01/21/17  4:46 PM  Result Value Ref Range Status   MRSA by PCR  NEGATIVE NEGATIVE Final    Comment:        The GeneXpert MRSA Assay (FDA approved for NASAL specimens only), is one component of a comprehensive MRSA colonization surveillance program. It is not intended to diagnose MRSA infection nor to guide or monitor treatment for MRSA infections.     RADIOLOGY:  No results found.   Management plans discussed with the patient, family and they are in agreement.  CODE STATUS:     Code Status Orders        Start     Ordered   01/21/17 1509  Full code  Continuous     01/21/17 1508    Code Status History    Date Active Date Inactive Code Status Order ID Comments User Context   This patient has a current code status but no historical code status.      TOTAL TIME TAKING CARE OF THIS PATIENT: 40 minutes.    Kentrel Clevenger M.D on 01/23/2017 at 9:03 AM  Between 7am to 6pm - Pager - [phone] After 6pm go to [email or web address] - password [email or web address]  [facility]  Office  [phone]  CC: Primary care physician; Patient, No Pcp Per

## 2017-01-23 NOTE — Discharge Summary (Signed)
Discharge Medications   Current Discharge Medication List    START taking these medications   Details  aspirin 81 MG chewable tablet Chew 1 tablet (81 mg total) by mouth daily. Qty: 90 tablet, Refills: 2    atorvastatin (LIPITOR) 80 MG tablet Take 1 tablet (80 mg total) by mouth daily at 6 PM. Qty: 90 tablet, Refills: 1    lisinopril (PRINIVIL,ZESTRIL) 2.5 MG tablet Take 1 tablet (2.5 mg total) by mouth daily. Qty: 90 tablet, Refills: 2    metoprolol tartrate (LOPRESSOR) 25 MG tablet Take 1 tablet (25 mg total) by mouth 2 (two) times daily. Qty: 180 tablet, Refills: 2    nicotine (NICODERM CQ - DOSED IN MG/24 HOURS) 21 mg/24hr patch Place 1 patch (21 mg total) onto the skin daily. Qty: 28 patch, Refills: 0    nitroGLYCERIN (NITROSTAT) 0.4 MG SL tablet Place 1 tablet (0.4 mg total) under the tongue every 5 (five) minutes as needed for chest pain. Qty: 20 tablet, Refills: 12    ticagrelor (BRILINTA) 90 MG TABS tablet Take 1 tablet (90 mg total) by mouth 2 (two) times daily. Qty: 180 tablet, Refills: 2      CONTINUE these medications which have NOT CHANGED   Details  sulfamethoxazole-trimethoprim (BACTRIM DS,SEPTRA DS) 800-160 MG tablet Take 1 tablet by mouth 2 (two) times daily. Qty: 14 tablet, Refills: 0        Aspirin prescribed at discharge: Yes  High Intensity Statin Prescribed? (Lipitor 40-80mg  or Crestor 20-40mg ):yes  Beta Blocker Prescribed: yes  ADP Receptor Inhibitor Prescribed? (i.e. Plavix etc.-Includes Medically Managed Patients): yes brilinta  Was EF assessed during THIS hospitalization? no (YES = Measured in current episode of care or document plan to evaluate after discharge.)  Was Cardiac Rehab II ordered? (Includes Medically managed Patients): yes

## 2017-01-24 ENCOUNTER — Encounter: Payer: Self-pay | Admitting: Internal Medicine

## 2018-01-23 ENCOUNTER — Encounter: Payer: Self-pay | Admitting: Emergency Medicine

## 2018-01-23 ENCOUNTER — Emergency Department: Payer: Self-pay

## 2018-01-23 DIAGNOSIS — Z7901 Long term (current) use of anticoagulants: Secondary | ICD-10-CM | POA: Insufficient documentation

## 2018-01-23 DIAGNOSIS — F17210 Nicotine dependence, cigarettes, uncomplicated: Secondary | ICD-10-CM | POA: Insufficient documentation

## 2018-01-23 DIAGNOSIS — R079 Chest pain, unspecified: Secondary | ICD-10-CM | POA: Insufficient documentation

## 2018-01-23 DIAGNOSIS — Z955 Presence of coronary angioplasty implant and graft: Secondary | ICD-10-CM | POA: Insufficient documentation

## 2018-01-23 DIAGNOSIS — I10 Essential (primary) hypertension: Secondary | ICD-10-CM | POA: Insufficient documentation

## 2018-01-23 DIAGNOSIS — I252 Old myocardial infarction: Secondary | ICD-10-CM | POA: Insufficient documentation

## 2018-01-23 DIAGNOSIS — Z79899 Other long term (current) drug therapy: Secondary | ICD-10-CM | POA: Insufficient documentation

## 2018-01-23 LAB — BASIC METABOLIC PANEL
Anion gap: 9 (ref 5–15)
BUN: 15 mg/dL (ref 6–20)
CHLORIDE: 106 mmol/L (ref 98–111)
CO2: 26 mmol/L (ref 22–32)
Calcium: 9.1 mg/dL (ref 8.9–10.3)
Creatinine, Ser: 1.12 mg/dL (ref 0.61–1.24)
GFR calc Af Amer: >60 mL/min (ref >60)
GFR calc non Af Amer: >60 mL/min (ref >60)
Glucose, Bld: 126 mg/dL — ABNORMAL HIGH (ref 70–99)
POTASSIUM: 4 mmol/L (ref 3.5–5.1)
SODIUM: 141 mmol/L (ref 135–145)

## 2018-01-23 LAB — CBC
HEMATOCRIT: 41.1 % (ref 40.0–52.0)
Hemoglobin: 14.6 g/dL (ref 13.0–18.0)
MCH: 32.2 pg (ref 26.0–34.0)
MCHC: 35.5 g/dL (ref 32.0–36.0)
MCV: 90.8 fL (ref 80.0–100.0)
Platelets: 219 10*3/uL (ref 150–440)
RBC: 4.52 MIL/uL (ref 4.40–5.90)
RDW: 14.2 % (ref 11.5–14.5)
WBC: 15.3 10*3/uL — AB (ref 3.8–10.6)

## 2018-01-23 LAB — TROPONIN I: Troponin I: <0.03 ng/mL (ref <0.03)

## 2018-01-23 NOTE — ED Triage Notes (Signed)
Pt c/o intermittent chest pain and tightness, worsening throughout the day. Accompanied by SOB. Pt denies N/V. Pt with previous MI last year, with 1 stent placement.

## 2018-01-24 ENCOUNTER — Other Ambulatory Visit: Payer: Self-pay

## 2018-01-24 ENCOUNTER — Emergency Department
Admission: EM | Admit: 2018-01-24 | Discharge: 2018-01-24 | Disposition: A | Discharge status: Home / Self Care | Payer: Self-pay | Attending: Emergency Medicine | Admitting: Emergency Medicine

## 2018-01-24 DIAGNOSIS — I10 Essential (primary) hypertension: Secondary | ICD-10-CM

## 2018-01-24 DIAGNOSIS — R079 Chest pain, unspecified: Secondary | ICD-10-CM

## 2018-01-24 HISTORY — DX: Acute myocardial infarction, unspecified: I21.9

## 2018-01-24 LAB — TROPONIN I

## 2018-01-24 NOTE — ED Provider Notes (Signed)
[facility] Emergency Department Provider Note  ____________________________________________   None    (approximate)  I have reviewed the triage vital signs and the nursing notes.   HISTORY  Chief Complaint Chest Pain   HPI Timothy Myers is a 42 y.o. male who presents to the emergency department for treatment and evaluation of chest tightness and pressure.  He states that he has these sharp pains in the middle of his chest "all the time."  He has a history of MI approximately 1 year ago and had PCI.  His last follow-up with cardiology was just after his event.  He takes a daily aspirin and cholesterol medicine.  He is not on any antihypertensives or beta-blockers.  Today, the difference that made him decide to come to the emergency department was the fact that he had pressure in his chest that did not resolve on its own for quite some time.  Past Medical History:  Diagnosis Date  . Hypertension   . MI (myocardial infarction) [facility](HCC)     Patient Active Problem List   Diagnosis Date Noted  . STEMI involving right coronary artery (HCC) 01/21/2017    Past Surgical History:  Procedure Laterality Date  . CORONARY/GRAFT ACUTE MI REVASCULARIZATION N/A 01/21/2017   Procedure: Coronary/Graft Acute MI Revascularization;  Surgeon: Alwyn Peaallwood, Dwayne D, MD;  Location: [facility] INVASIVE CV LAB;  Service: Cardiovascular;  Laterality: N/A;  . LEFT HEART CATH AND CORONARY ANGIOGRAPHY N/A 01/21/2017   Procedure: LEFT HEART CATH AND CORONARY ANGIOGRAPHY;  Surgeon: Alwyn Peaallwood, Dwayne D, MD;  Location: [facility] INVASIVE CV LAB;  Service: Cardiovascular;  Laterality: N/A;    Prior to Admission medications   Medication Sig Start Date End Date Taking? Authorizing Provider  aspirin 81 MG chewable tablet Chew 1 tablet (81 mg total) by mouth daily. 01/24/17   Enedina FinnerPatel, Sona, MD  atorvastatin (LIPITOR) 80 MG tablet Take 1 tablet (80 mg total) by mouth daily at 6 PM. 01/23/17   Enedina FinnerPatel, Sona, MD    clopidogrel (PLAVIX) 75 MG tablet Take 1 tablet by mouth daily. 09/09/17   [provider]  lisinopril (PRINIVIL,ZESTRIL) 2.5 MG tablet Take 1 tablet (2.5 mg total) by mouth daily. 01/24/17   Enedina FinnerPatel, Sona, MD  metoprolol tartrate (LOPRESSOR) 25 MG tablet Take 1 tablet (25 mg total) by mouth 2 (two) times daily. 01/23/17   Enedina FinnerPatel, Sona, MD  nitroGLYCERIN (NITROSTAT) 0.4 MG SL tablet Place 1 tablet (0.4 mg total) under the tongue every 5 (five) minutes as needed for chest pain. 01/23/17   Enedina FinnerPatel, Sona, MD  ticagrelor (BRILINTA) 90 MG TABS tablet Take 1 tablet (90 mg total) by mouth 2 (two) times daily. 01/23/17   Enedina FinnerPatel, Sona, MD    Allergies Penicillins  History reviewed. No pertinent family history.  Social History Social History   Tobacco Use  . Smoking status: Current Some Day Smoker    Packs/day: 1.50    Years: 30.00    Pack years: 45.00    Types: Cigarettes  . Smokeless tobacco: Never Used  Substance Use Topics  . Alcohol use: No  . Drug use: Yes    Types: Marijuana    Comment: Yesterday    Review of Systems  Constitutional: No fever/chills Eyes: No visual changes. ENT: No sore throat. Cardiovascular: Positive for chest pain. Respiratory: Denies shortness of breath. Gastrointestinal: No abdominal pain.  No nausea, no vomiting.  Genitourinary: Negative for dysuria. Musculoskeletal: Negative for back pain. Skin: Negative for rash. Neurological: Negative for headaches, focal  weakness or numbness. ____________________________________________   PHYSICAL EXAM:  VITAL SIGNS: ED Triage Vitals  Enc Vitals Group     BP 01/23/18 2052 (!) 144/95     Pulse Rate 01/23/18 2052 77     Resp 01/23/18 2052 17     Temp 01/23/18 2052 98 F (36.7 C)     Temp Source 01/23/18 2052 Oral     SpO2 01/23/18 2052 99 %     Weight --      Height --      Head Circumference --      Peak Flow --      Pain Score 01/24/18 0031 0     Pain Loc --      Pain Edu? --      Excl. in GC? --      Constitutional: Alert and oriented. Well appearing and in no acute distress. Eyes: Conjunctivae are normal. Head: Atraumatic. Nose: No congestion/rhinnorhea. Mouth/Throat: Mucous membranes are moist.  Oropharynx non-erythematous. Neck: No stridor.   Cardiovascular: Normal rate, regular rhythm. Grossly normal heart sounds.  Good peripheral circulation. Respiratory: Normal respiratory effort.  No retractions. Lungs CTAB. Gastrointestinal: Soft and nontender. No distention. No abdominal bruits. Musculoskeletal: No lower extremity tenderness nor edema.  No joint effusions. Neurologic:  Normal speech and language. No gross focal neurologic deficits are appreciated. No gait instability. Skin:  Skin is warm, dry and intact. No rash noted. Psychiatric: Mood and affect are normal. Speech and behavior are normal.  ____________________________________________   LABS (all labs ordered are listed, but only abnormal results are displayed)  Labs Reviewed  BASIC METABOLIC PANEL - Abnormal; Notable for the following components:      Result Value   Glucose, Bld 126 (*)    All other components within normal limits  CBC - Abnormal; Notable for the following components:   WBC 15.3 (*)    All other components within normal limits  TROPONIN I  TROPONIN I   ____________________________________________  EKG  ED ECG REPORT I, Katalaya Beel,  viewed and interpreted this ECG.   Date: 01/23/2018  EKG Time: 8:54 PM  Rate: 71  Rhythm: unchanged from previous tracings  Axis: Abnormal axis deviation  Intervals:right bundle branch block  ST&T Change: No ST change  ____________________________________________  RADIOLOGY  ED MD interpretation: Chest x-ray negative for acute cardiopulmonary abnormality per radiology.  Official radiology report(s): Dg Chest 2 View  Result Date: 01/23/2018 CLINICAL DATA:  41 year old male with intermittent chest pain and tightness, worsening throughout the day.  Some associated shortness of breath. No associated nausea or vomiting. EXAM: CHEST - 2 VIEW COMPARISON:  Chest x-ray 10/21/2009. FINDINGS: Lung volumes are normal. No consolidative airspace disease. No pleural effusions. No pneumothorax. No pulmonary nodule or mass noted. Pulmonary vasculature and the cardiomediastinal silhouette are within normal limits. IMPRESSION: No radiographic evidence of acute cardiopulmonary disease. Electronically Signed   By: Trudie Reed M.D.   On: 01/23/2018 21:14    ____________________________________________   PROCEDURES  Procedure(s) performed: None  Procedures  Critical Care performed: No  ____________________________________________   INITIAL IMPRESSION / ASSESSMENT AND PLAN / ED COURSE  As part of my medical decision making, I reviewed the following data within the electronic MEDICAL RECORD NUMBER Old EKG reviewed and Notes from prior ED visits   42 year old male presenting to the emergency department for treatment and evaluation of chest pain and pressure.  Patient states that he followed up with Dr. call at after his MI and was taken off of  his antihypertensive.  He states that every time that he has his blood pressure checked, he is told that it is high.  ----------------------------------------- 1:42 AM on 01/24/2018 -----------------------------------------  Patient advised the nurse that he would like to sign out AGAINST MEDICAL ADVICE.  He does not want to wait on his second troponin.  He was advised of the risk of leaving before the second troponin was resulted.  He states that he must go to work and cannot afford to miss a day.  He states that he will "take his chances."  His significant other was in the room also who could not persuade him to stay.  He was strongly advised to call Dr. Juliann Pares and schedule a follow-up appointment.  He was advised to return to the emergency department immediately for symptoms of change or worsen.       ____________________________________________   FINAL CLINICAL IMPRESSION(S) / ED DIAGNOSES  Final diagnoses:  Hypertension, unspecified type  Nonspecific chest pain     ED Discharge Orders    None       Note:  This document was prepared using Dragon voice recognition software and may include unintentional dictation errors.    Chinita Pester, FNP 01/24/18 0145    Phineas Semen, MD 01/24/18 [phone]

## 2018-01-24 NOTE — ED Notes (Signed)
Pt states that he had some tightness and pressure in his chest this afternoon. Describes them also as sharp. Pt alert and oriented x 4. Family at bedside

## 2020-03-06 ENCOUNTER — Encounter: Payer: Self-pay | Admitting: Emergency Medicine

## 2020-03-06 ENCOUNTER — Emergency Department
Admission: EM | Admit: 2020-03-06 | Discharge: 2020-03-06 | Disposition: A | Discharge status: Home / Self Care | Payer: Self-pay | Attending: Emergency Medicine | Admitting: Emergency Medicine

## 2020-03-06 ENCOUNTER — Emergency Department: Payer: Self-pay

## 2020-03-06 ENCOUNTER — Other Ambulatory Visit: Payer: Self-pay

## 2020-03-06 DIAGNOSIS — W231XXA Caught, crushed, jammed, or pinched between stationary objects, initial encounter: Secondary | ICD-10-CM | POA: Insufficient documentation

## 2020-03-06 DIAGNOSIS — Y9301 Activity, walking, marching and hiking: Secondary | ICD-10-CM | POA: Insufficient documentation

## 2020-03-06 DIAGNOSIS — F17210 Nicotine dependence, cigarettes, uncomplicated: Secondary | ICD-10-CM | POA: Insufficient documentation

## 2020-03-06 DIAGNOSIS — S93401A Sprain of unspecified ligament of right ankle, initial encounter: Secondary | ICD-10-CM | POA: Insufficient documentation

## 2020-03-06 DIAGNOSIS — F1590 Other stimulant use, unspecified, uncomplicated: Secondary | ICD-10-CM | POA: Insufficient documentation

## 2020-03-06 DIAGNOSIS — I10 Essential (primary) hypertension: Secondary | ICD-10-CM | POA: Insufficient documentation

## 2020-03-06 DIAGNOSIS — Z79899 Other long term (current) drug therapy: Secondary | ICD-10-CM | POA: Insufficient documentation

## 2020-03-06 DIAGNOSIS — Z7982 Long term (current) use of aspirin: Secondary | ICD-10-CM | POA: Insufficient documentation

## 2020-03-06 MED ORDER — MELOXICAM 15 MG PO TABS: 15.0000 mg | ORAL_TABLET | Freq: Every day | ORAL | 0 refills | Status: DC
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
## 2020-03-06 NOTE — ED Notes (Signed)
See triage note  States he had a sharp pain to top of foot which lasted only a couple of mins  Now having pain and dwelling to lateral aspect of ankle

## 2020-07-15 ENCOUNTER — Encounter
Admission: EM | Disposition: A | Discharge status: Home / Self Care | Payer: Self-pay | Source: Home / Self Care | Attending: Internal Medicine

## 2020-07-15 ENCOUNTER — Inpatient Hospital Stay
Admission: EM | Admit: 2020-07-15 | Discharge: 2020-07-17 | DRG: 246 | Disposition: A | Discharge status: Home / Self Care | Payer: Self-pay | Attending: Internal Medicine | Admitting: Internal Medicine

## 2020-07-15 ENCOUNTER — Inpatient Hospital Stay (HOSPITAL_COMMUNITY)
Admit: 2020-07-15 | Discharge: 2020-07-15 | Disposition: A | Discharge status: Home / Self Care | Payer: Self-pay | Attending: Internal Medicine | Admitting: Internal Medicine

## 2020-07-15 ENCOUNTER — Other Ambulatory Visit: Payer: Self-pay

## 2020-07-15 ENCOUNTER — Encounter: Payer: Self-pay | Admitting: Emergency Medicine

## 2020-07-15 DIAGNOSIS — I213 ST elevation (STEMI) myocardial infarction of unspecified site: Secondary | ICD-10-CM | POA: Insufficient documentation

## 2020-07-15 DIAGNOSIS — Z7902 Long term (current) use of antithrombotics/antiplatelets: Secondary | ICD-10-CM

## 2020-07-15 DIAGNOSIS — E785 Hyperlipidemia, unspecified: Secondary | ICD-10-CM | POA: Diagnosis present

## 2020-07-15 DIAGNOSIS — I252 Old myocardial infarction: Secondary | ICD-10-CM

## 2020-07-15 DIAGNOSIS — I5021 Acute systolic (congestive) heart failure: Secondary | ICD-10-CM | POA: Diagnosis present

## 2020-07-15 DIAGNOSIS — Z20822 Contact with and (suspected) exposure to covid-19: Secondary | ICD-10-CM | POA: Diagnosis present

## 2020-07-15 DIAGNOSIS — Z720 Tobacco use: Secondary | ICD-10-CM

## 2020-07-15 DIAGNOSIS — I110 Hypertensive heart disease with heart failure: Secondary | ICD-10-CM | POA: Diagnosis present

## 2020-07-15 DIAGNOSIS — I2510 Atherosclerotic heart disease of native coronary artery without angina pectoris: Secondary | ICD-10-CM | POA: Diagnosis present

## 2020-07-15 DIAGNOSIS — Z7982 Long term (current) use of aspirin: Secondary | ICD-10-CM

## 2020-07-15 DIAGNOSIS — I77810 Thoracic aortic ectasia: Secondary | ICD-10-CM | POA: Diagnosis present

## 2020-07-15 DIAGNOSIS — Z880 Allergy status to penicillin: Secondary | ICD-10-CM

## 2020-07-15 DIAGNOSIS — I2121 ST elevation (STEMI) myocardial infarction involving left circumflex coronary artery: Principal | ICD-10-CM | POA: Diagnosis present

## 2020-07-15 DIAGNOSIS — Z79899 Other long term (current) drug therapy: Secondary | ICD-10-CM

## 2020-07-15 DIAGNOSIS — F17210 Nicotine dependence, cigarettes, uncomplicated: Secondary | ICD-10-CM | POA: Diagnosis present

## 2020-07-15 DIAGNOSIS — R079 Chest pain, unspecified: Secondary | ICD-10-CM

## 2020-07-15 DIAGNOSIS — F17213 Nicotine dependence, cigarettes, with withdrawal: Secondary | ICD-10-CM

## 2020-07-15 DIAGNOSIS — Z955 Presence of coronary angioplasty implant and graft: Secondary | ICD-10-CM

## 2020-07-15 DIAGNOSIS — F17200 Nicotine dependence, unspecified, uncomplicated: Secondary | ICD-10-CM | POA: Diagnosis present

## 2020-07-15 DIAGNOSIS — Z791 Long term (current) use of non-steroidal anti-inflammatories (NSAID): Secondary | ICD-10-CM

## 2020-07-15 DIAGNOSIS — I10 Essential (primary) hypertension: Secondary | ICD-10-CM

## 2020-07-15 HISTORY — PX: LEFT HEART CATH AND CORONARY ANGIOGRAPHY: CATH118249

## 2020-07-15 HISTORY — PX: CORONARY/GRAFT ACUTE MI REVASCULARIZATION: CATH118305

## 2020-07-15 LAB — BASIC METABOLIC PANEL
Anion gap: 10 (ref 5–15)
BUN: 15 mg/dL (ref 6–20)
CO2: 24 mmol/L (ref 22–32)
Calcium: 8.9 mg/dL (ref 8.9–10.3)
Chloride: 104 mmol/L (ref 98–111)
Creatinine, Ser: 0.87 mg/dL (ref 0.61–1.24)
GFR, Estimated: >60 mL/min (ref >60)
Glucose, Bld: 140 mg/dL — ABNORMAL HIGH (ref 70–99)
Potassium: 3.4 mmol/L — ABNORMAL LOW (ref 3.5–5.1)
Sodium: 138 mmol/L (ref 135–145)

## 2020-07-15 LAB — CBC
HCT: 42.9 % (ref 39.0–52.0)
Hemoglobin: 14.4 g/dL (ref 13.0–17.0)
MCH: 31.2 pg (ref 26.0–34.0)
MCHC: 33.6 g/dL (ref 30.0–36.0)
MCV: 92.9 fL (ref 80.0–100.0)
Platelets: 222 10*3/uL (ref 150–400)
RBC: 4.62 MIL/uL (ref 4.22–5.81)
RDW: 13.6 % (ref 11.5–15.5)
WBC: 20.2 10*3/uL — ABNORMAL HIGH (ref 4.0–10.5)
nRBC: 0 % (ref 0.0–0.2)

## 2020-07-15 LAB — POCT ACTIVATED CLOTTING TIME
Activated Clotting Time: 231 seconds
Activated Clotting Time: 243 seconds
Activated Clotting Time: 243 seconds

## 2020-07-15 LAB — ECHOCARDIOGRAM COMPLETE
Area-P 1/2: 3.03 cm2
Height: 76 in
S' Lateral: 4.05 cm
Weight: 3350.99 oz

## 2020-07-15 LAB — GLUCOSE, CAPILLARY: Glucose-Capillary: 149 mg/dL — ABNORMAL HIGH (ref 70–99)

## 2020-07-15 LAB — TROPONIN I (HIGH SENSITIVITY)
Troponin I (High Sensitivity): 28 ng/L — ABNORMAL HIGH (ref <18)
Troponin I (High Sensitivity): >27000 ng/L (ref <18)

## 2020-07-15 LAB — MRSA PCR SCREENING: MRSA by PCR: NEGATIVE

## 2020-07-15 LAB — SARS CORONAVIRUS 2 BY RT PCR (HOSPITAL ORDER, PERFORMED IN ~~LOC~~ HOSPITAL LAB): SARS Coronavirus 2: NEGATIVE

## 2020-07-15 SURGERY — CORONARY/GRAFT ACUTE MI REVASCULARIZATION
Anesthesia: Moderate Sedation

## 2020-07-15 MED ORDER — MIDAZOLAM HCL 2 MG/2ML IJ SOLN
INTRAMUSCULAR | Status: DC | PRN
  Administered 2020-07-15 (×2): 1 mg via INTRAVENOUS

## 2020-07-15 MED ORDER — PRASUGREL HCL 10 MG PO TABS
10.0000 mg | ORAL_TABLET | Freq: Every day | ORAL | Status: DC
  Administered 2020-07-16 – 2020-07-17 (×2): 10 mg via ORAL
  Filled 2020-07-15 (×2): qty 1

## 2020-07-15 MED ORDER — TIROFIBAN HCL IV 12.5 MG/250 ML
INTRAVENOUS | Status: AC | PRN
  Administered 2020-07-15: 0.15 ug/kg/min via INTRAVENOUS

## 2020-07-15 MED ORDER — TIROFIBAN (AGGRASTAT) BOLUS VIA INFUSION
INTRAVENOUS | Status: DC | PRN
  Administered 2020-07-15: 2375 ug via INTRAVENOUS

## 2020-07-15 MED ORDER — METOPROLOL TARTRATE 25 MG PO TABS
25.0000 mg | ORAL_TABLET | Freq: Two times a day (BID) | ORAL | Status: DC
  Administered 2020-07-15 – 2020-07-17 (×4): 25 mg via ORAL
  Filled 2020-07-15 (×3): qty 1

## 2020-07-15 MED ORDER — SODIUM CHLORIDE 0.9 % IV SOLN: 250.0000 mL | INTRAVENOUS | Status: DC | PRN

## 2020-07-15 MED ORDER — NICOTINE 21 MG/24HR TD PT24
21.0000 mg | MEDICATED_PATCH | Freq: Every day | TRANSDERMAL | Status: DC
  Administered 2020-07-15 – 2020-07-17 (×3): 21 mg via TRANSDERMAL
  Filled 2020-07-15 (×3): qty 1

## 2020-07-15 MED ORDER — LABETALOL HCL 5 MG/ML IV SOLN: 10.0000 mg | INTRAVENOUS | Status: AC | PRN

## 2020-07-15 MED ORDER — ASPIRIN 81 MG PO CHEW
81.0000 mg | CHEWABLE_TABLET | Freq: Every day | ORAL | Status: DC
  Administered 2020-07-16 – 2020-07-17 (×2): 81 mg via ORAL
  Filled 2020-07-15 (×2): qty 1

## 2020-07-15 MED ORDER — MORPHINE SULFATE (PF) 2 MG/ML IV SOLN
2.0000 mg | INTRAVENOUS | Status: AC | PRN
  Administered 2020-07-15 – 2020-07-16 (×3): 2 mg via INTRAVENOUS
  Filled 2020-07-15 (×3): qty 1

## 2020-07-15 MED ORDER — ENOXAPARIN SODIUM 40 MG/0.4ML ~~LOC~~ SOLN
40.0000 mg | SUBCUTANEOUS | Status: DC
  Administered 2020-07-16 – 2020-07-17 (×2): 40 mg via SUBCUTANEOUS
  Filled 2020-07-15 (×2): qty 0.4

## 2020-07-15 MED ORDER — ONDANSETRON HCL 4 MG/2ML IJ SOLN
4.0000 mg | Freq: Four times a day (QID) | INTRAMUSCULAR | Status: DC | PRN
  Administered 2020-07-15: 4 mg via INTRAVENOUS
  Filled 2020-07-15 (×2): qty 2

## 2020-07-15 MED ORDER — HEPARIN SODIUM (PORCINE) 1000 UNIT/ML IJ SOLN
INTRAMUSCULAR | Status: DC | PRN
  Administered 2020-07-15: 5000 [IU] via INTRAVENOUS
  Administered 2020-07-15: 4000 [IU] via INTRAVENOUS
  Administered 2020-07-15: 2000 [IU] via INTRAVENOUS

## 2020-07-15 MED ORDER — HEPARIN SODIUM (PORCINE) 1000 UNIT/ML IJ SOLN
INTRAMUSCULAR | Status: AC
  Filled 2020-07-15: qty 1

## 2020-07-15 MED ORDER — ATORVASTATIN CALCIUM 20 MG PO TABS
80.0000 mg | ORAL_TABLET | Freq: Every day | ORAL | Status: DC
  Administered 2020-07-15 – 2020-07-16 (×2): 80 mg via ORAL
  Filled 2020-07-15 (×2): qty 4

## 2020-07-15 MED ORDER — TIROFIBAN HCL IV 12.5 MG/250 ML
0.1500 ug/kg/min | INTRAVENOUS | Status: AC
  Administered 2020-07-15 – 2020-07-16 (×2): 0.15 ug/kg/min via INTRAVENOUS
  Filled 2020-07-15: qty 250
  Filled 2020-07-15 (×3): qty 100

## 2020-07-15 MED ORDER — VERAPAMIL HCL 2.5 MG/ML IV SOLN
INTRAVENOUS | Status: AC
  Filled 2020-07-15: qty 2

## 2020-07-15 MED ORDER — IOHEXOL 300 MG/ML  SOLN
INTRAMUSCULAR | Status: DC | PRN
  Administered 2020-07-15: 175 mL

## 2020-07-15 MED ORDER — HYDRALAZINE HCL 20 MG/ML IJ SOLN: 10.0000 mg | INTRAMUSCULAR | Status: AC | PRN

## 2020-07-15 MED ORDER — ACETAMINOPHEN 325 MG PO TABS: 650.0000 mg | ORAL_TABLET | ORAL | Status: DC | PRN

## 2020-07-15 MED ORDER — ONDANSETRON HCL 4 MG/2ML IJ SOLN
4.0000 mg | INTRAMUSCULAR | Status: DC
  Administered 2020-07-16 – 2020-07-17 (×8): 4 mg via INTRAVENOUS
  Filled 2020-07-15 (×9): qty 2

## 2020-07-15 MED ORDER — MORPHINE SULFATE (PF) 4 MG/ML IV SOLN
INTRAVENOUS | Status: AC
  Filled 2020-07-15: qty 1

## 2020-07-15 MED ORDER — PRASUGREL HCL 10 MG PO TABS
ORAL_TABLET | ORAL | Status: DC | PRN
  Administered 2020-07-15: 60 mg via ORAL

## 2020-07-15 MED ORDER — HEPARIN (PORCINE) IN NACL 2000-0.9 UNIT/L-% IV SOLN
INTRAVENOUS | Status: DC | PRN
  Administered 2020-07-15: 1000 mL

## 2020-07-15 MED ORDER — CHLORHEXIDINE GLUCONATE CLOTH 2 % EX PADS
6.0000 | MEDICATED_PAD | Freq: Every day | CUTANEOUS | Status: DC
  Administered 2020-07-15 – 2020-07-16 (×2): 6 via TOPICAL

## 2020-07-15 MED ORDER — FUROSEMIDE 10 MG/ML IJ SOLN
INTRAMUSCULAR | Status: AC
  Filled 2020-07-15: qty 4

## 2020-07-15 MED ORDER — TIROFIBAN HCL IV 12.5 MG/250 ML
INTRAVENOUS | Status: AC
  Filled 2020-07-15: qty 250

## 2020-07-15 MED ORDER — SODIUM CHLORIDE 0.9% FLUSH: 3.0000 mL | INTRAVENOUS | Status: DC | PRN

## 2020-07-15 MED ORDER — MORPHINE SULFATE (PF) 4 MG/ML IV SOLN
INTRAVENOUS | Status: DC | PRN
  Administered 2020-07-15 (×2): 2 mg via INTRAVENOUS

## 2020-07-15 MED ORDER — POTASSIUM CHLORIDE CRYS ER 20 MEQ PO TBCR
40.0000 meq | EXTENDED_RELEASE_TABLET | ORAL | Status: AC
  Administered 2020-07-15: 40 meq via ORAL
  Filled 2020-07-15: qty 2

## 2020-07-15 MED ORDER — FENTANYL CITRATE (PF) 100 MCG/2ML IJ SOLN
INTRAMUSCULAR | Status: DC | PRN
  Administered 2020-07-15: 50 ug via INTRAVENOUS
  Administered 2020-07-15 (×2): 25 ug via INTRAVENOUS

## 2020-07-15 MED ORDER — NITROGLYCERIN IN D5W 200-5 MCG/ML-% IV SOLN
0.0000 ug/min | INTRAVENOUS | Status: DC
  Administered 2020-07-15 – 2020-07-16 (×2): 50 ug/min via INTRAVENOUS
  Filled 2020-07-15: qty 250

## 2020-07-15 MED ORDER — PNEUMOCOCCAL VAC POLYVALENT 25 MCG/0.5ML IJ INJ: 0.5000 mL | INJECTION | INTRAMUSCULAR | Status: DC

## 2020-07-15 MED ORDER — SODIUM CHLORIDE 0.9% FLUSH
3.0000 mL | Freq: Two times a day (BID) | INTRAVENOUS | Status: DC
  Administered 2020-07-16 – 2020-07-17 (×2): 3 mL via INTRAVENOUS

## 2020-07-15 MED ORDER — NITROGLYCERIN 1 MG/10 ML FOR IR/CATH LAB
INTRA_ARTERIAL | Status: DC | PRN
  Administered 2020-07-15 (×2): 200 ug

## 2020-07-15 MED ORDER — NITROGLYCERIN IN D5W 200-5 MCG/ML-% IV SOLN
INTRAVENOUS | Status: AC | PRN
  Administered 2020-07-15: 10 ug/min via INTRAVENOUS

## 2020-07-15 MED ORDER — FENTANYL CITRATE (PF) 100 MCG/2ML IJ SOLN
INTRAMUSCULAR | Status: AC
  Filled 2020-07-15: qty 2

## 2020-07-15 MED ORDER — PRASUGREL HCL 10 MG PO TABS
ORAL_TABLET | ORAL | Status: AC
  Filled 2020-07-15: qty 6

## 2020-07-15 MED ORDER — FUROSEMIDE 10 MG/ML IJ SOLN
INTRAMUSCULAR | Status: DC | PRN
  Administered 2020-07-15: 40 mg via INTRAVENOUS

## 2020-07-15 MED ORDER — MIDAZOLAM HCL 2 MG/2ML IJ SOLN
INTRAMUSCULAR | Status: AC
  Filled 2020-07-15: qty 2

## 2020-07-15 SURGICAL SUPPLY — 18 items
BALLN EUPHORA RX 2.0X12 (BALLOONS) ×2
BALLN TREK RX 3.0X12 (BALLOONS) ×2
BALLN ~~LOC~~ TREK RX 3.75X12 (BALLOONS) ×2
BALLOON EUPHORA RX 2.0X12 (BALLOONS) ×1 IMPLANT
BALLOON TREK RX 3.0X12 (BALLOONS) IMPLANT
BALLOON ~~LOC~~ TREK RX 3.75X12 (BALLOONS) IMPLANT
CATH INFINITI 5 FR JL3.5 (CATHETERS) ×1 IMPLANT
CATH INFINITI JR4 5F (CATHETERS) ×2 IMPLANT
CATH LAUNCHER 6FR EBU3.5 (CATHETERS) ×1 IMPLANT
DEVICE RAD TR BAND REGULAR (VASCULAR PRODUCTS) ×2 IMPLANT
GLIDESHEATH SLEND SS 6F .021 (SHEATH) ×1 IMPLANT
GUIDEWIRE INQWIRE 1.5J.035X260 (WIRE) IMPLANT
INQWIRE 1.5J .035X260CM (WIRE) ×2
KIT ENCORE 26 ADVANTAGE (KITS) ×2 IMPLANT
KIT MANI 3VAL PERCEP (MISCELLANEOUS) ×2 IMPLANT
PACK CARDIAC CATH (CUSTOM PROCEDURE TRAY) ×2 IMPLANT
STENT RESOLUTE ONYX 3.5X18 (Permanent Stent) ×1 IMPLANT
WIRE RUNTHROUGH .014X180CM (WIRE) ×1 IMPLANT

## 2020-07-15 NOTE — ED Triage Notes (Signed)
Patient arrives via EMS for chest pain/STEMI starting around 1300.Patient has a history of MI with stent placement 3 years ago. Patient AOx4 at this time.

## 2020-07-15 NOTE — Progress Notes (Signed)
CD Stemi to ED. Patient was already taken to cath lab. Met girlfriend Tomi Bamberger. Passed her information to medical staff. Nurse and doctor came to update family member of patient condition. I and nurse prayed with girlfriend, who is still dealing with grief from her dad passing a few years ago. She stated she sat in same waiting room during that period. I was able to stay with her until doctor updated her. She was able to calm down before I left her.

## 2020-07-15 NOTE — Consult Note (Signed)
Cardiology Consultation:   Patient ID: Timothy Myers MRN: [id number]; DOB: 1976/01/10  Admit date: 07/15/2020 Date of Consult: 07/15/2020  Primary Care Provider: Patient, No Pcp Per [facility] Cardiologist: Dorothyann Peng, MD [facility] Electrophysiologist:  None    Patient Profile:   Timothy Myers is a 45 y.o. male with a hx of coronary artery disease with inferior STEMI in 01/2017 status post PCI, hypertension, hyperlipidemia, and ongoing tobacco abuse, who is being seen today for the evaluation of chest pain and abnormal EKG at the request of Dr. Darnelle Catalan.  History of Present Illness:   Timothy Myers reports that he was in his usual state of health until early this afternoon when he began to experience sudden chest pain while cutting wood with a chainsaw.  In retrospect, he reports having had some intermittent chest pains recently, including yesterday lasting about 30 to 60 minutes.  Pain that began this afternoon was severe, substernal, and associated with shortness of breath and numbness in both arms.  He called 911 and was found to have inferior ST elevation with associated posterior changes.  He was given aspirin and transported to the ED where he continued to complain of 9/10 chest pain.  Patient was taken for emergent catheterization, which showed occluded mid LCx and patent distal RCA stent.  There was nonobstructive disease elsewhere.  He is status post primary PCI to the LCx but has continued to have 6/10 chest pain, likely due to thrombus embolization into small distal branches that cannot be treated percutaneously.   Past Medical History:  Diagnosis Date  . Hypertension   . MI (myocardial infarction) [facility])     Past Surgical History:  Procedure Laterality Date  . CORONARY/GRAFT ACUTE MI REVASCULARIZATION N/A 01/21/2017   Procedure: Coronary/Graft Acute MI Revascularization;  Surgeon: Alwyn Pea, MD;  Location: [facility] INVASIVE CV LAB;  Service:  Cardiovascular;  Laterality: N/A;  . LEFT HEART CATH AND CORONARY ANGIOGRAPHY N/A 01/21/2017   Procedure: LEFT HEART CATH AND CORONARY ANGIOGRAPHY;  Surgeon: Alwyn Pea, MD;  Location: [facility] INVASIVE CV LAB;  Service: Cardiovascular;  Laterality: N/A;     Home Medications:  Prior to Admission medications   Medication Sig Start Date Mandisa Persinger Date Taking? Authorizing Provider  aspirin 81 MG chewable tablet Chew 1 tablet (81 mg total) by mouth daily. 01/24/17   Enedina Finner, MD  atorvastatin (LIPITOR) 80 MG tablet Take 1 tablet (80 mg total) by mouth daily at 6 PM. 01/23/17   Enedina Finner, MD  clopidogrel (PLAVIX) 75 MG tablet Take 1 tablet by mouth daily. 09/09/17   [provider]  lisinopril (PRINIVIL,ZESTRIL) 2.5 MG tablet Take 1 tablet (2.5 mg total) by mouth daily. 01/24/17   Enedina Finner, MD  meloxicam (MOBIC) 15 MG tablet Take 1 tablet (15 mg total) by mouth daily. 03/06/20   Triplett, Rulon Eisenmenger B, FNP  metoprolol tartrate (LOPRESSOR) 25 MG tablet Take 1 tablet (25 mg total) by mouth 2 (two) times daily. 01/23/17   Enedina Finner, MD  nitroGLYCERIN (NITROSTAT) 0.4 MG SL tablet Place 1 tablet (0.4 mg total) under the tongue every 5 (five) minutes as needed for chest pain. 01/23/17   Enedina Finner, MD  ticagrelor (BRILINTA) 90 MG TABS tablet Take 1 tablet (90 mg total) by mouth 2 (two) times daily. 01/23/17   Enedina Finner, MD    Inpatient Medications: Scheduled Meds: . [START ON 07/16/2020] aspirin  81 mg Oral Daily  . atorvastatin  80 mg Oral q1800  . [START  ON 07/16/2020] enoxaparin (LOVENOX) injection  40 mg Subcutaneous Q24H  . metoprolol tartrate  25 mg Oral BID  . [START ON 07/16/2020] prasugrel  10 mg Oral Daily  . sodium chloride flush  3 mL Intravenous Q12H   Continuous Infusions: . sodium chloride    . nitroGLYCERIN     PRN Meds: sodium chloride, acetaminophen, hydrALAZINE, labetalol, morphine injection, ondansetron (ZOFRAN) IV, sodium chloride flush  Allergies:    Allergies  Allergen  Reactions  . Penicillins Anaphylaxis    Social History:   Social History   Tobacco Use  . Smoking status: Current Some Day Smoker    Packs/day: 1.50    Years: 30.00    Pack years: 45.00    Types: Cigarettes  . Smokeless tobacco: Never Used  Substance Use Topics  . Alcohol use: No  . Drug use: Yes    Types: Marijuana    Comment: Yesterday    Family History:   Unable to obtain due to critical illness.  ROS:  Review of Systems  Unable to perform ROS: Acuity of condition    Physical Exam/Data:   Vitals:   07/15/20 1430 07/15/20 1431 07/15/20 1432  BP:  (!) 147/94   Pulse:  70   SpO2: 99%    Weight:   95 kg  Height:   6\' 4"  (1.93 m)   No intake or output data in the 24 hours ending 07/15/20 1615 Last 3 Weights 07/15/2020 03/06/2020 01/21/2017  Weight (lbs) 209 lb 7 oz 210 lb 211 lb 10.3 oz  Weight (kg) 95 kg 95.255 kg 96 kg     Body mass index is 25.49 kg/m.  General: Uncomfortable appearing man, lying on EMS stretcher. HEENT: normal Lymph: no adenopathy Neck: no JVD Endocrine:  No thryomegaly Vascular: No carotid bruits; 2+ radial pulses bilaterally Cardiac:  normal S1, S2; RRR; no murmurs, rubs, or gallops. Lungs:  clear to auscultation bilaterally, no wheezing, rhonchi or rales  Abd: soft, nontender, no hepatomegaly  Ext: no edema Musculoskeletal:  No deformities, BUE and BLE strength normal and equal Skin: warm and dry  Neuro:  CNs 2-12 intact, no focal abnormalities noted Psych:  Normal affect   EKG:  The EKG was personally reviewed and demonstrates:  Normal sinus rhythm with inferior ST elevation and posterior changes. Telemetry:  Telemetry was personally reviewed and demonstrates: Normal sinus rhythm with PVCs  Relevant CV Studies: Conclusions: 1. Severe single-vessel coronary artery disease with thrombotic occlusion of mid LCx leading to inferior/posterior STEMI. 2. Mild to moderate, nonobstructive coronary artery disease involving proximal LCx, LAD,  and RCA. 3. Widely patent distal RCA stent. 4. Mildly to moderately reduced left ventricular ejection fraction with mid/apical anterolateral hypokinesis and moderately elevated filling pressure. 5. Successful PCI to mid LCx using resolute Onyx 3.5 x 18 mm drug-eluting stent with 0% residual stenosis and TIMI-3 flow other than occlusion of small distal branches due to embolization.  Diagnostic Dominance: Right    Intervention      Laboratory Data:  High Sensitivity Troponin:   Recent Labs  Lab 07/15/20 1444  TROPONINIHS 28*     ChemistryNo results for input(s): NA, K, CL, CO2, GLUCOSE, BUN, CREATININE, CALCIUM, GFRNONAA, GFRAA, ANIONGAP in the last 168 hours.  No results for input(s): PROT, ALBUMIN, AST, ALT, ALKPHOS, BILITOT in the last 168 hours. HematologyNo results for input(s): WBC, RBC, HGB, HCT, MCV, MCH, MCHC, RDW, PLT in the last 168 hours. BNPNo results for input(s): BNP, PROBNP in the last 168 hours.  DDimer No results for input(s): DDIMER in the last 168 hours.   Radiology/Studies:  CARDIAC CATHETERIZATION  Result Date: 07/15/2020 Conclusions: 1. Severe single-vessel coronary artery disease with thrombotic occlusion of mid LCx leading to inferior/posterior STEMI. 2. Mild to moderate, nonobstructive coronary artery disease involving proximal LCx, LAD, and RCA. 3. Widely patent distal RCA stent. 4. Mildly to moderately reduced left ventricular ejection fraction with mid/apical anterolateral hypokinesis and moderately elevated filling pressure. 5. Successful PCI to mid LCx using resolute Onyx 3.5 x 18 mm drug-eluting stent with 0% residual stenosis and TIMI-3 flow other than occlusion of small distal branches due to embolization. Recommendations: 1. Dual antiplatelet therapy with aspirin and prasugrel for at least 12 months. 2. Continue tirofiban infusion for 18 hours. 3. Titrate nitroglycerin infusion for relief of chest pain.  As needed morphine is also available. 4.  Aggressive secondary prevention. 5. Obtain echocardiogram to better evaluate LV systolic function. Yvonne Kendall, MD [facility]     Assessment and Plan:   Inferior/posterior STEMI: Patient presented with acute onset of chest pain due to thrombotic occlusion of mid LCx (type I MI).  He underwent successful primary PCI to the mid LCx with some embolization of thrombus and 2 distal branches, which are too small to intervene upon.  He has continued to have significant chest pain though it is gradually easing off with medical therapy.  Post catheterization EKG shows resolution of ST elevation.  Admit to ICU under the hospitalist service.  Case was discussed with Dr. Joylene Igo.  Continue tirofiban infusion for 18 hours.  Dual antiplatelet therapy with aspirin and prasugrel for at least 12 months.  Titrate nitroglycerin infusion for relief of chest pain.  Morphine as needed for chest pain.  Start metoprolol tartrate 25 mg twice daily.  High intensity statin therapy.  Smoking cessation encouraged.  Follow-up echocardiogram.  Replete potassium.  Acute HFrEF: LVEF noted to be mildly to moderately reduced by left ventriculogram with moderately elevated LVEDP.  The patient given furosemide 40 mg IV x1 in the Cath Lab with brisk diuresis.  Monitor urine output and consider redosing furosemide.  Replete potassium to maintain level greater than 4.0.  Follow-up echocardiogram.  Start low-dose metoprolol.  Consider adding ACE inhibitor/ARB prior to discharge based on blood pressure and renal function.  Hypertension:  Titrate nitroglycerin, as above for relief of chest pain as blood pressure and headache tolerate.  Start low-dose metoprolol.  Tobacco abuse:  Nicotine patch provided.  Smoking cessation encouraged.  Disposition: Admit to ICU on the hospitalist service.  As the patient follows with Dr. Juliann Pares, [facility] clinic cardiology will assume cardiology care for the patient  tomorrow.   For questions or updates, please contact [facility] Please consult [email or web address] for contact info under    Signed, Yvonne Kendall, MD  07/15/2020 4:15 PM

## 2020-07-15 NOTE — Progress Notes (Signed)
Clothing is underwear and pants

## 2020-07-15 NOTE — H&P (Signed)
History and Physical    Timothy Myers:[id number] DOB: 1975/08/15 DOA: 07/15/2020  PCP: Patient, No Pcp Per   Patient coming from: Home  I have personally briefly reviewed patient's old medical records in [facility]  Chief Complaint: Chest pain  HPI: Timothy Myers is a 45 y.o. male with medical history significant for coronary artery disease status post stent angioplasty who presented to the ER via EMS for evaluation of chest pain which started around 1 PM.  He rated his pain an 8 x 10 in intensity at its worst and describes as a substernal, sharp, pleuritic pain with radiation to his arms and associated with shortness of breath.  Pain feels similar to his last cardiac event.  Patient states that he had intermittent chest pain 1 day prior to his admission lasting about 30 to 60 minutes but on the day of his admission it was persistent.  EMS was called and he was found to have inferior ST elevation with associated posterior change.  He was given aspirin and transported to the ER for further evaluation. He denies having any nausea, no vomiting, no dizziness, no lightheadedness, no abdominal pain, no diaphoresis, no palpitations, no orthopnea, no lower extremity swelling, no PND, no changes in his bowel habits, no urinary symptoms, no dizziness or lightheadedness. COVID-19 PCR test is negative Twelve-lead EKG showed inferior ST elevation MI   ED Course: Patient is a 45 year old male with a history of coronary artery disease status post prior stent angioplasty who presented to the ER for evaluation of severe substernal chest pain which he rated a 9 x 10 in intensity at its worst with radiation to both arms and associated with shortness of breath.  Twelve-lead EKG shows ST elevation in the inferior leads.  Patient was taken emergently to the Cath Lab which showed occluded mid left circumflex and patent distal RCA stent.  Patient is status post primary PCI to the left circumflex but  continues to have chest pain though improved from admission.  He rates his pain a 2 x 10 in intensity at its worst and this is most likely due to thrombus embolization into small distal branches that cannot be treated percutaneously.  Review of Systems: As per HPI otherwise all systems reviewed and negative.    Past Medical History:  Diagnosis Date  . Hypertension   . MI (myocardial infarction) [facility])     Past Surgical History:  Procedure Laterality Date  . CORONARY/GRAFT ACUTE MI REVASCULARIZATION N/A 01/21/2017   Procedure: Coronary/Graft Acute MI Revascularization;  Surgeon: Alwyn Pea, MD;  Location: [facility] INVASIVE CV LAB;  Service: Cardiovascular;  Laterality: N/A;  . LEFT HEART CATH AND CORONARY ANGIOGRAPHY N/A 01/21/2017   Procedure: LEFT HEART CATH AND CORONARY ANGIOGRAPHY;  Surgeon: Alwyn Pea, MD;  Location: [facility] INVASIVE CV LAB;  Service: Cardiovascular;  Laterality: N/A;     reports that he has been smoking cigarettes. He has a 45.00 pack-year smoking history. He has never used smokeless tobacco. He reports current drug use. Drug: Marijuana. He reports that he does not drink alcohol.  Allergies  Allergen Reactions  . Penicillins Anaphylaxis    No family history on file.   Prior to Admission medications   Medication Sig Start Date End Date Taking? Authorizing Provider  aspirin 81 MG chewable tablet Chew 1 tablet (81 mg total) by mouth daily. 01/24/17   Enedina Finner, MD  atorvastatin (LIPITOR) 80 MG tablet Take 1 tablet (80 mg total) by mouth daily  at 6 PM. 01/23/17   Enedina Finner, MD  clopidogrel (PLAVIX) 75 MG tablet Take 1 tablet by mouth daily. 09/09/17   [provider]  lisinopril (PRINIVIL,ZESTRIL) 2.5 MG tablet Take 1 tablet (2.5 mg total) by mouth daily. 01/24/17   Enedina Finner, MD  meloxicam (MOBIC) 15 MG tablet Take 1 tablet (15 mg total) by mouth daily. 03/06/20   Triplett, Rulon Eisenmenger B, FNP  metoprolol tartrate (LOPRESSOR) 25 MG tablet Take 1 tablet (25 mg  total) by mouth 2 (two) times daily. 01/23/17   Enedina Finner, MD  nitroGLYCERIN (NITROSTAT) 0.4 MG SL tablet Place 1 tablet (0.4 mg total) under the tongue every 5 (five) minutes as needed for chest pain. 01/23/17   Enedina Finner, MD  ticagrelor (BRILINTA) 90 MG TABS tablet Take 1 tablet (90 mg total) by mouth 2 (two) times daily. 01/23/17   Enedina Finner, MD    Physical Exam: Vitals:   07/15/20 1618 07/15/20 1630 07/15/20 1645 07/15/20 1700  BP:  123/80 120/82   Pulse:  64 65 73  Resp:  16 (!) 21 17  Temp: (!) 96.9 F (36.1 C)     TempSrc: Oral     SpO2:  95% 93% 98%  Weight:  95 kg    Height:  6\' 4"  (1.93 m)       Vitals:   07/15/20 1618 07/15/20 1630 07/15/20 1645 07/15/20 1700  BP:  123/80 120/82   Pulse:  64 65 73  Resp:  16 (!) 21 17  Temp: (!) 96.9 F (36.1 C)     TempSrc: Oral     SpO2:  95% 93% 98%  Weight:  95 kg    Height:  6\' 4"  (1.93 m)      Constitutional: NAD, alert and oriented x 3 Eyes: PERRL, lids and conjunctivae normal ENMT: Mucous membranes are moist.  Neck: normal, supple, no masses, no thyromegaly Respiratory: clear to auscultation bilaterally, no wheezing, no crackles. Normal respiratory effort. No accessory muscle use.  Cardiovascular: Regular rate and rhythm, no murmurs / rubs / gallops. No extremity edema. 2+ pedal pulses. No carotid bruits.  Abdomen: no tenderness, no masses palpated. No hepatosplenomegaly. Bowel sounds positive.  Musculoskeletal: no clubbing / cyanosis. No joint deformity upper and lower extremities.  Skin: no rashes, lesions, ulcers.  Neurologic: No gross focal neurologic deficit.  By me generally 6 Psychiatric: Normal mood and affect.   Labs on Admission: I have personally reviewed following labs and imaging studies  CBC: No results for input(s): WBC, NEUTROABS, HGB, HCT, MCV, PLT in the last 168 hours. Basic Metabolic Panel: No results for input(s): NA, K, CL, CO2, GLUCOSE, BUN, CREATININE, CALCIUM, MG, PHOS in the last 168  hours. GFR: CrCl cannot be calculated (Patient's most recent lab result is older than the maximum 21 days allowed.). Liver Function Tests: No results for input(s): AST, ALT, ALKPHOS, BILITOT, PROT, ALBUMIN in the last 168 hours. No results for input(s): LIPASE, AMYLASE in the last 168 hours. No results for input(s): AMMONIA in the last 168 hours. Coagulation Profile: No results for input(s): INR, PROTIME in the last 168 hours. Cardiac Enzymes: No results for input(s): CKTOTAL, CKMB, CKMBINDEX, TROPONINI in the last 168 hours. BNP (last 3 results) No results for input(s): PROBNP in the last 8760 hours. HbA1C: No results for input(s): HGBA1C in the last 72 hours. CBG: Recent Labs  Lab 07/15/20 1621  GLUCAP 149*   Lipid Profile: No results for input(s): CHOL, HDL, LDLCALC, TRIG, CHOLHDL, LDLDIRECT in  the last 72 hours. Thyroid Function Tests: No results for input(s): TSH, T4TOTAL, FREET4, T3FREE, THYROIDAB in the last 72 hours. Anemia Panel: No results for input(s): VITAMINB12, FOLATE, FERRITIN, TIBC, IRON, RETICCTPCT in the last 72 hours. Urine analysis: No results found for: COLORURINE, APPEARANCEUR, LABSPEC, PHURINE, GLUCOSEU, HGBUR, BILIRUBINUR, KETONESUR, PROTEINUR, UROBILINOGEN, NITRITE, LEUKOCYTESUR  Radiological Exams on Admission: CARDIAC CATHETERIZATION  Result Date: 07/15/2020 Conclusions: 1. Severe single-vessel coronary artery disease with thrombotic occlusion of mid LCx leading to inferior/posterior STEMI. 2. Mild to moderate, nonobstructive coronary artery disease involving proximal LCx, LAD, and RCA. 3. Widely patent distal RCA stent. 4. Mildly to moderately reduced left ventricular ejection fraction with mid/apical anterolateral hypokinesis and moderately elevated filling pressure. 5. Successful PCI to mid LCx using resolute Onyx 3.5 x 18 mm drug-eluting stent with 0% residual stenosis and TIMI-3 flow other than occlusion of small distal branches due to embolization.  Recommendations: 1. Dual antiplatelet therapy with aspirin and prasugrel for at least 12 months. 2. Continue tirofiban infusion for 18 hours. 3. Titrate nitroglycerin infusion for relief of chest pain.  As needed morphine is also available. 4. Aggressive secondary prevention. 5. Obtain echocardiogram to better evaluate LV systolic function. Yvonne Kendall, MD [facility]    EKG: Independently reviewed.  ST elevation in the inferior leads  Assessment/Plan Principal Problem:   ST elevation myocardial infarction involving left circumflex coronary artery (HCC) Active Problems:   Nicotine dependence   CAD (coronary artery disease)   ST elevation MI In a patient with known coronary artery disease Patient presented for evaluation of severe substernal chest pain and was found to have ST elevation in the inferior wall He was taken emergently to the Cath Lab and was found to have an occluded mid left circumflex with a patent distal RCA stent.  He is status post primary PCI to the left circumflex but continues to have mild chest pain likely due to thrombus embolization into the small distal branches that cannot be treated percutaneously. Continue aspirin, Effient, metoprolol and statins Continue as needed morphine and nitroglycerin for pain control Lifestyle modification has been discussed with patient in detail    Nicotine dependence Smoking cessation was discussed with patient in detail He is willing to quit smoking he will be placed on nicotine transdermal patch 21 mg daily      DVT prophylaxis: Lovenox Code Status: Full code Family Communication: Greater than 50% of time was spent discussing patient's condition and plan of care with him at the bedside.  All questions and concerns have been addressed.  He verbalizes understanding and agrees with the plan. Disposition Plan: Back to previous home environment Consults called: Cardiology    Lucile Shutters MD [facility]     07/15/2020, 5:15 PM

## 2020-07-15 NOTE — Progress Notes (Signed)
Girlfriend Timothy Myers has been updated and is waiting in the specials recovery waiting room. [phone]. Cath lab staff updated.

## 2020-07-15 NOTE — Plan of Care (Signed)
Patient admitted from cath lab with STEMI S/P PCI. Ongoing chest pain on nitro infusion.   Problem: Education: Goal: Knowledge of General Education information will improve Description: Including pain rating scale, medication(s)/side effects and non-pharmacologic comfort measures Outcome: Progressing   Problem: Health Behavior/Discharge Planning: Goal: Ability to manage health-related needs will improve Outcome: Progressing   Problem: Clinical Measurements: Goal: Ability to maintain clinical measurements within normal limits will improve Outcome: Progressing Goal: Will remain free from infection Outcome: Progressing Goal: Diagnostic test results will improve Outcome: Progressing Goal: Respiratory complications will improve Outcome: Progressing Goal: Cardiovascular complication will be avoided Outcome: Progressing   Problem: Activity: Goal: Risk for activity intolerance will decrease Outcome: Progressing   Problem: Nutrition: Goal: Adequate nutrition will be maintained Outcome: Progressing   Problem: Coping: Goal: Level of anxiety will decrease Outcome: Progressing   Problem: Elimination: Goal: Will not experience complications related to bowel motility Outcome: Progressing Goal: Will not experience complications related to urinary retention Outcome: Progressing   Problem: Pain Managment: Goal: General experience of comfort will improve Outcome: Progressing   Problem: Safety: Goal: Ability to remain free from injury will improve Outcome: Progressing   Problem: Skin Integrity: Goal: Risk for impaired skin integrity will decrease Outcome: Progressing   Problem: Education: Goal: Understanding of cardiac disease, CV risk reduction, and recovery process will improve Outcome: Progressing Goal: Understanding of medication regimen will improve Outcome: Progressing Goal: Individualized Educational Video(s) Outcome: Progressing   Problem: Activity: Goal: Ability to  tolerate increased activity will improve Outcome: Progressing   Problem: Cardiac: Goal: Ability to achieve and maintain adequate cardiopulmonary perfusion will improve Outcome: Progressing Goal: Vascular access site(s) Level 0-1 will be maintained Outcome: Progressing   Problem: Health Behavior/Discharge Planning: Goal: Ability to safely manage health-related needs after discharge will improve Outcome: Progressing

## 2020-07-15 NOTE — Progress Notes (Signed)
Brief Pharmacy Note  Consult for Aggrastat x 18 hours post cath. Order has been entered. CBC with morning labs.  Laureen Ochs, PharmD

## 2020-07-15 NOTE — ED Notes (Signed)
EKG given to End, MD. MD said acceptable to send to cath lab now prior to getting full set of VS and blood work. Patient transferred to cath lab with EMS, MD, and nurse.

## 2020-07-15 NOTE — ED Provider Notes (Signed)
[facility] Emergency Department Provider Note   ____________________________________________   None    (approximate)  I have reviewed the triage vital signs and the nursing notes.   HISTORY  Chief Complaint Chest Pain    HPI Timothy Myers is a 45 y.o. male who comes in with EMS complaining of chest pain.  Its pleuritic and sharp and tight.  It feels like when he had his stent put in before.  It is all over his whole chest and makes his arms go numb.        Past Medical History:  Diagnosis Date  . Hypertension   . MI (myocardial infarction) [facility])     Patient Active Problem List   Diagnosis Date Noted  . STEMI involving right coronary artery (HCC) 01/21/2017    Past Surgical History:  Procedure Laterality Date  . CORONARY/GRAFT ACUTE MI REVASCULARIZATION N/A 01/21/2017   Procedure: Coronary/Graft Acute MI Revascularization;  Surgeon: Alwyn Pea, MD;  Location: [facility] INVASIVE CV LAB;  Service: Cardiovascular;  Laterality: N/A;  . LEFT HEART CATH AND CORONARY ANGIOGRAPHY N/A 01/21/2017   Procedure: LEFT HEART CATH AND CORONARY ANGIOGRAPHY;  Surgeon: Alwyn Pea, MD;  Location: [facility] INVASIVE CV LAB;  Service: Cardiovascular;  Laterality: N/A;    Prior to Admission medications   Medication Sig Start Date End Date Taking? Authorizing Provider  aspirin 81 MG chewable tablet Chew 1 tablet (81 mg total) by mouth daily. 01/24/17   Enedina Finner, MD  atorvastatin (LIPITOR) 80 MG tablet Take 1 tablet (80 mg total) by mouth daily at 6 PM. 01/23/17   Enedina Finner, MD  clopidogrel (PLAVIX) 75 MG tablet Take 1 tablet by mouth daily. 09/09/17   [provider]  lisinopril (PRINIVIL,ZESTRIL) 2.5 MG tablet Take 1 tablet (2.5 mg total) by mouth daily. 01/24/17   Enedina Finner, MD  meloxicam (MOBIC) 15 MG tablet Take 1 tablet (15 mg total) by mouth daily. 03/06/20   Triplett, Rulon Eisenmenger B, FNP  metoprolol tartrate (LOPRESSOR) 25 MG tablet Take 1 tablet (25  mg total) by mouth 2 (two) times daily. 01/23/17   Enedina Finner, MD  nitroGLYCERIN (NITROSTAT) 0.4 MG SL tablet Place 1 tablet (0.4 mg total) under the tongue every 5 (five) minutes as needed for chest pain. 01/23/17   Enedina Finner, MD  ticagrelor (BRILINTA) 90 MG TABS tablet Take 1 tablet (90 mg total) by mouth 2 (two) times daily. 01/23/17   Enedina Finner, MD    Allergies Penicillins  No family history on file.  Social History Social History   Tobacco Use  . Smoking status: Current Some Day Smoker    Packs/day: 1.50    Years: 30.00    Pack years: 45.00    Types: Cigarettes  . Smokeless tobacco: Never Used  Substance Use Topics  . Alcohol use: No  . Drug use: Yes    Types: Marijuana    Comment: Yesterday    Review of Systems  Constitutional: No fever/chills Eyes: No visual changes. ENT: No sore throat. Cardiovascular: chest pain. Respiratory:  shortness of breath. Gastrointestinal: No abdominal pain.  No nausea, no vomiting.  No diarrhea.  No constipation. Genitourinary: Negative for dysuria. Musculoskeletal: Negative for back pain. Skin: Negative for rash. Neurological: Negative for headaches, focal weakness   ____________________________________________   PHYSICAL EXAM:  VITAL SIGNS: ED Triage Vitals  Enc Vitals Group     BP      Pulse      Resp  Temp      Temp src      SpO2      Weight      Height      Head Circumference      Peak Flow      Pain Score      Pain Loc      Pain Edu?      Excl. in GC?    Constitutional: Alert oriented. Well appearing and in no acute distress. Eyes: Conjunctivae are normal.  Head: Atraumatic. Nose: No congestion/rhinnorhea. Mouth/Throat: Mucous membranes are moist.  Oropharynx non-erythematous. Neck: No stridor.  Cardiovascular: Normal rate, regular rhythm. Grossly normal heart sounds.  Good peripheral circulation. Respiratory: Normal respiratory effort.  No retractions. Lungs CTAB. Gastrointestinal: Soft and nontender.  No distention. No abdominal bruits. No CVA tenderness. Musculoskeletal: No lower extremity tenderness nor edema.  Neurologic:  Normal speech and language. No gross focal neurologic deficits are appreciated.. Skin:  Skin is warm, dry and intact. No rash noted.   ____________________________________________   LABS (all labs ordered are listed, but only abnormal results are displayed)  Labs Reviewed - No data to display ____________________________________________  EKG  EKG read interpreted by me shows no especially in V3 and 4.  There may be some ST elevation inferiorly into 3 and F but it does not appear to be significant on the EKG that EMS sent. EKG done in the emergency room with only read leads I to III and L.  There does appear to be ST elevation in leads II and III. ____________________________________________  RADIOLOGY Jill Poling, personally viewed and evaluated these images (plain radiographs) as part of my medical decision making, as well as reviewing the written report by the radiologist.  ED MD interpretation:    Official radiology report(s): No results found.  ____________________________________________   PROCEDURES  Procedure(s) performed (including Critical Care):  Procedures   ____________________________________________   INITIAL IMPRESSION / ASSESSMENT AND PLAN / ED COURSE  Dr. Okey Dupre sees the patient with me.  There does appear to be elevation inferiorly on the leads that we can see on the hospital EKG.  Is taking the patient to the Cath Lab.             ____________________________________________   FINAL CLINICAL IMPRESSION(S) / ED DIAGNOSES  Final diagnoses:  ST elevation myocardial infarction (STEMI), unspecified artery [facility])     ED Discharge Orders    None      *Please note:  Timothy Myers was evaluated in Emergency Department on 07/15/2020 for the symptoms described in the history of present illness. He was  evaluated in the context of the global COVID-19 pandemic, which necessitated consideration that the patient might be at risk for infection with the SARS-CoV-2 virus that causes COVID-19. Institutional protocols and algorithms that pertain to the evaluation of patients at risk for COVID-19 are in a state of rapid change based on information released by regulatory bodies including the CDC and federal and state organizations. These policies and algorithms were followed during the patient's care in the ED.  Some ED evaluations and interventions may be delayed as a result of limited staffing during and the pandemic.*   Note:  This document was prepared using Dragon voice recognition software and may include unintentional dictation errors.    Arnaldo Natal, MD 07/15/20 1434

## 2020-07-15 NOTE — Progress Notes (Signed)
Dr. Okey Dupre called to bedside for ongoing CP, not responding to morphine or nitro. Continuing nausea as well. Patient also having runs of VT. Plan: give scheduled metop early per Dr. Okey Dupre; obtain Echo; continue on infusions.

## 2020-07-16 ENCOUNTER — Encounter: Payer: Self-pay | Admitting: Internal Medicine

## 2020-07-16 LAB — CBC
HCT: 40.4 % (ref 39.0–52.0)
Hemoglobin: 14.2 g/dL (ref 13.0–17.0)
MCH: 31.4 pg (ref 26.0–34.0)
MCHC: 35.1 g/dL (ref 30.0–36.0)
MCV: 89.4 fL (ref 80.0–100.0)
Platelets: 212 10*3/uL (ref 150–400)
RBC: 4.52 MIL/uL (ref 4.22–5.81)
RDW: 13.4 % (ref 11.5–15.5)
WBC: 19.6 10*3/uL — ABNORMAL HIGH (ref 4.0–10.5)
nRBC: 0 % (ref 0.0–0.2)

## 2020-07-16 LAB — BASIC METABOLIC PANEL
Anion gap: 9 (ref 5–15)
BUN: 15 mg/dL (ref 6–20)
CO2: 27 mmol/L (ref 22–32)
Calcium: 9 mg/dL (ref 8.9–10.3)
Chloride: 103 mmol/L (ref 98–111)
Creatinine, Ser: 0.82 mg/dL (ref 0.61–1.24)
GFR, Estimated: >60 mL/min (ref >60)
Glucose, Bld: 156 mg/dL — ABNORMAL HIGH (ref 70–99)
Potassium: 3.6 mmol/L (ref 3.5–5.1)
Sodium: 139 mmol/L (ref 135–145)

## 2020-07-16 LAB — URINE DRUG SCREEN, QUALITATIVE (ARMC ONLY)
Amphetamines, Ur Screen: POSITIVE — AB
Barbiturates, Ur Screen: NOT DETECTED
Benzodiazepine, Ur Scrn: POSITIVE — AB
Cannabinoid 50 Ng, Ur ~~LOC~~: POSITIVE — AB
Cocaine Metabolite,Ur ~~LOC~~: NOT DETECTED
MDMA (Ecstasy)Ur Screen: NOT DETECTED
Methadone Scn, Ur: NOT DETECTED
Opiate, Ur Screen: POSITIVE — AB
Phencyclidine (PCP) Ur S: NOT DETECTED
Tricyclic, Ur Screen: NOT DETECTED

## 2020-07-16 LAB — TROPONIN I (HIGH SENSITIVITY)
Troponin I (High Sensitivity): >27000 ng/L (ref <18)
Troponin I (High Sensitivity): >27000 ng/L (ref <18)

## 2020-07-16 MED ORDER — PROMETHAZINE HCL 25 MG/ML IJ SOLN
12.5000 mg | Freq: Four times a day (QID) | INTRAMUSCULAR | Status: DC | PRN
  Filled 2020-07-16: qty 1

## 2020-07-16 NOTE — Progress Notes (Signed)
Became primary RN @ 0000.   Pt complaining of 8-9/10 chest pain near 0000. PRN morphine given 1x & scheduled Zofran.   Chest pain resolved over course of night, pt not complaining of any pain. Pt sleeping adequately between 0100-0600. This AM upon awakening, pt increasingly nauseous and vomiting. Can given scheduled Zofran @ 0700.   Urine sample sent this AM, see results.   Nitro gtt titrated down from 125 to 30 over course of night. Difficult to titrate gtt because pt noncompliant w/ wearing BP cuff. SBP remains above 100.

## 2020-07-16 NOTE — Progress Notes (Signed)
[facility] Cardiology    SUBJECTIVE: Timothy Myers is a 44 year old male with a past medical history significant for coronary artery disease with an inferior STEMI s/p PCI in 2018, hypertension, hyperlipidemia, and tobacco abuse who presented to the ED on 07/15/20 for an acute onset of chest pain.  ECG revealed inferior ST elevations with reciprocal changes and he underwent LHC with Dr. Okey Dupre.  LHC revealed an occluded mid LCx and a patent distal RCA stent, and he received PCI to the LCx with a Resolute Onyx 3.5 x 43mm DES.    He is followed in outpatient cardiology by Dr. Juliann Pares.  Echocardiogram performed this admission on 07/15/20 revealed normal RV systolic function with mildly reduced LV systolic function, an EF of 40-45% with mild LVH, a mildly dilated aortic root, measuring 29mm and mild dilation of the ascending aorta, measuring 6mm.    07/16/20: Timothy Myers reports severe chest pain/discomfort following the LHC yesterday that eventually resolved early this morning.  He's currently on a nitro drip and reports a mild headache.  He denies any shortness of breath, lower extremity swelling, orthopnea, or PND.  He denies any pain, bleeding, or drainage over right radial access site. He has been smoking since he was 45 years old, currently 2 packs a day.    Vitals:   07/16/20 0745 07/16/20 0800 07/16/20 0815 07/16/20 0830  BP:    120/78  Pulse: 79 61 73 65  Resp:      Temp:      TempSrc:      SpO2: 96% 95% 95% 98%  Weight:      Height:         Intake/Output Summary (Last 24 hours) at 07/16/2020 2778 Last data filed at 07/16/2020 0400 Gross per 24 hour  Intake 811.13 ml  Output 2500 ml  Net -1688.87 ml      PHYSICAL EXAM  General: Well developed, well nourished, in no acute distress HEENT:  Normocephalic and atramatic Neck:  No JVD.  Lungs: Expiratory wheezes noted in upper and lower lung fields bilaterally  Heart: HRRR . Normal S1 and S2 without gallops or murmurs.  Abdomen: Bowel sounds  are positive, abdomen soft and non-tender  Msk:  Back normal.  Normal strength and tone for age. Extremities: No clubbing, cyanosis or edema.   Neuro: Alert and oriented X 3. Psych:  Good affect, responds appropriately   LABS: Basic Metabolic Panel: Recent Labs    07/15/20 1626 07/16/20 0213  NA 138 139  K 3.4* 3.6  CL 104 103  CO2 24 27  GLUCOSE 140* 156*  BUN 15 15  CREATININE 0.87 0.82  CALCIUM 8.9 9.0   Liver Function Tests: No results for input(s): AST, ALT, ALKPHOS, BILITOT, PROT, ALBUMIN in the last 72 hours. No results for input(s): LIPASE, AMYLASE in the last 72 hours. CBC: Recent Labs    07/15/20 1626 07/16/20 0213  WBC 20.2* 19.6*  HGB 14.4 14.2  HCT 42.9 40.4  MCV 92.9 89.4  PLT 222 212   Cardiac Enzymes: No results for input(s): CKTOTAL, CKMB, CKMBINDEX, TROPONINI in the last 72 hours. BNP: Invalid input(s): POCBNP D-Dimer: No results for input(s): DDIMER in the last 72 hours. Hemoglobin A1C: No results for input(s): HGBA1C in the last 72 hours. Fasting Lipid Panel: No results for input(s): CHOL, HDL, LDLCALC, TRIG, CHOLHDL, LDLDIRECT in the last 72 hours. Thyroid Function Tests: No results for input(s): TSH, T4TOTAL, T3FREE, THYROIDAB in the last 72 hours.  Invalid input(s): FREET3  Anemia Panel: No results for input(s): VITAMINB12, FOLATE, FERRITIN, TIBC, IRON, RETICCTPCT in the last 72 hours.  CARDIAC CATHETERIZATION  Result Date: 07/15/2020 Conclusions: 1. Severe single-vessel coronary artery disease with thrombotic occlusion of mid LCx leading to inferior/posterior STEMI. 2. Mild to moderate, nonobstructive coronary artery disease involving proximal LCx, LAD, and RCA. 3. Widely patent distal RCA stent. 4. Mildly to moderately reduced left ventricular ejection fraction with mid/apical anterolateral hypokinesis and moderately elevated filling pressure. 5. Successful PCI to mid LCx using resolute Onyx 3.5 x 18 mm drug-eluting stent with 0% residual  stenosis and TIMI-3 flow other than occlusion of small distal branches due to embolization. Recommendations: 1. Dual antiplatelet therapy with aspirin and prasugrel for at least 12 months. 2. Continue tirofiban infusion for 18 hours. 3. Titrate nitroglycerin infusion for relief of chest pain.  As needed morphine is also available. 4. Aggressive secondary prevention. 5. Obtain echocardiogram to better evaluate LV systolic function. Yvonne Kendall, MD [facility]   ECHOCARDIOGRAM COMPLETE  Result Date: 07/15/2020    ECHOCARDIOGRAM REPORT   [name] Date of Exam: 07/15/2020 Medical Rec #:  [id number]          Height:       76.0 in Accession #:    [id number]         Weight:       209.4 lb Date of Birth:  1975-12-06          BSA:          2.259 m Patient Age:    44 years           BP:           116/69 mmHg Patient Gender: M                  HR:           76 bpm. Exam Location:  [facility] Procedure: 2D Echo, Cardiac Doppler and Color Doppler Indications:     I21.9 Acute Myocardial Infarction  History:         Patient has no prior history of Echocardiogram examinations.                  Risk Factors:Hypertension. Myocardial Infarction.  Sonographer:     Sedonia Small Rodgers-Jones Referring Phys:  [id number] CHRISTOPHER END Diagnosing Phys: Yvonne Kendall MD IMPRESSIONS  1. Left ventricular ejection fraction, by estimation, is 40 to 45%. The left ventricle has moderately decreased function. The left ventricle demonstrates regional wall motion abnormalities (see scoring diagram/findings for description). The left ventricular internal cavity size was mildly dilated. There is mild left ventricular hypertrophy. Left ventricular diastolic parameters are consistent with Grade I diastolic dysfunction (impaired relaxation). There is severe hypokinesis of the left ventricular, basal-mid inferior wall and inferolateral wall.  2. Right ventricular systolic function is normal. The right ventricular size is normal.  Tricuspid regurgitation signal is inadequate for assessing PA pressure.  3. The mitral valve is normal in structure. Trivial mitral valve regurgitation.  4. The aortic valve is tricuspid. Aortic valve regurgitation is not visualized. No aortic stenosis is present.  5. Aortic dilatation noted. There is mild dilatation of the aortic root, measuring 42 mm. There is mild dilatation of the ascending aorta, measuring 40 mm.  6. The inferior vena cava is dilated in size with >50% respiratory variability, suggesting right atrial pressure of 8 mmHg. FINDINGS  Left Ventricle: Left ventricular ejection fraction, by estimation, is 40 to 45%. The  left ventricle has moderately decreased function. The left ventricle demonstrates regional wall motion abnormalities. Severe hypokinesis of the left ventricular, basal-mid inferior wall and inferolateral wall. The left ventricular internal cavity size was mildly dilated. There is mild left ventricular hypertrophy. Left ventricular diastolic parameters are consistent with Grade I diastolic dysfunction (impaired relaxation). Right Ventricle: The right ventricular size is normal. No increase in right ventricular wall thickness. Right ventricular systolic function is normal. Tricuspid regurgitation signal is inadequate for assessing PA pressure. Left Atrium: Left atrial size was normal in size. Right Atrium: Right atrial size was normal in size. Pericardium: There is no evidence of pericardial effusion. Mitral Valve: The mitral valve is normal in structure. Trivial mitral valve regurgitation. Tricuspid Valve: The tricuspid valve is normal in structure. Tricuspid valve regurgitation is trivial. Aortic Valve: The aortic valve is tricuspid. Aortic valve regurgitation is not visualized. No aortic stenosis is present. Pulmonic Valve: The pulmonic valve was grossly normal. Pulmonic valve regurgitation is not visualized. No evidence of pulmonic stenosis. Aorta: Aortic dilatation noted. There is  mild dilatation of the aortic root, measuring 42 mm. There is mild dilatation of the ascending aorta, measuring 40 mm. Pulmonary Artery: The pulmonary artery is of normal size. Venous: The inferior vena cava is dilated in size with greater than 50% respiratory variability, suggesting right atrial pressure of 8 mmHg. IAS/Shunts: The interatrial septum was not well visualized.  LEFT VENTRICLE PLAX 2D LVIDd:         5.60 cm  Diastology LVIDs:         4.05 cm  LV e' medial:    5.77 cm/s LV PW:         1.07 cm  LV E/e' medial:  9.7 LV IVS:        1.08 cm  LV e' lateral:   8.38 cm/s LVOT diam:     2.60 cm  LV E/e' lateral: 6.6 LV SV:         83 LV SV Index:   37 LVOT Area:     5.31 cm  RIGHT VENTRICLE             IVC RV Basal diam:  3.80 cm     IVC diam: 2.09 cm RV S prime:     15.30 cm/s TAPSE (M-mode): 2.8 cm LEFT ATRIUM             Index       RIGHT ATRIUM           Index LA diam:        4.20 cm 1.86 cm/m  RA Area:     15.80 cm LA Vol (A2C):   35.5 ml 15.71 ml/m RA Volume:   47.50 ml  21.03 ml/m LA Vol (A4C):   22.5 ml 9.96 ml/m LA Biplane Vol: 30.3 ml 13.41 ml/m  AORTIC VALVE LVOT Vmax:   86.70 cm/s LVOT Vmean:  55.300 cm/s LVOT VTI:    0.157 m  AORTA Ao Root diam: 4.20 cm Ao Asc diam:  4.00 cm MITRAL VALVE MV Area (PHT): 3.03 cm    SHUNTS MV Decel Time: 250 msec    Systemic VTI:  0.16 m MV E velocity: 55.70 cm/s  Systemic Diam: 2.60 cm MV A velocity: 78.80 cm/s MV E/A ratio:  0.71 Cristal Deer End MD Electronically signed by Yvonne Kendall MD Signature Date/Time: 07/15/2020/9:06:58 PM    Final      Echo: Moderately reduced LV systolic function, an EF of 40-45% with a mildly dilated  aortic root and mildly dilated ascending aorta   TELEMETRY: Normal sinus rhythm   ASSESSMENT AND PLAN:  Principal Problem:   ST elevation myocardial infarction involving left circumflex coronary artery (HCC) Active Problems:   Nicotine dependence   CAD (coronary artery disease)    1.  STEMI   -S/p PCI to the left  circumflex   -Currently on Aggrastat   -Will need to remain on dual antiplatelet therapy of aspirin 81mg  and prasugrel 10mg  daily for at least 1 year  -Continue metoprolol tartrate 25mg  BID and atorvastatin 80mg  daily   -Prior to discharge, will initiate ACE/ARB   2.  Hypertension   -Currently normotensive on metoprolol tartrate 25mg  BID   3.  Hyperlipidemia   -Continue atorvastatin 80mg  daily   4.  Tobacco abuse   -Patient has been smoking since he was 45 years old, currently smoking 2 packs a day; stressed the importance of smoking cessation   -Patch in place   5.  Dilated aortic root/dilated ascending aorta   -Will need to be followed closely in an outpatient setting   The history, physical exam findings, and plan of care were all discussed with Dr. , and all decision making was made in collaboration.    PA-C 07/16/2020 9:03 AM

## 2020-07-16 NOTE — Progress Notes (Signed)
PROGRESS NOTE    Timothy Myers  ZOX:[id number] DOB: [birth year] DOA: 07/15/2020 PCP: Patient, No Pcp Per    Chief Complaint  Patient presents with  . Chest Pain    Brief Narrative:   45 year old gentleman prior history of coronary artery disease s/p PCI tobacco abuse comes in with chest pain.  Twelve-lead EKG showed inferior ST elevation MI.  Patient was emergently taken to Cath Lab, showing occluded mid left circumflex and patent distal RCA stent.  He underwent PCI to the left circumflex, continue to have chest pain, remains in ICU on nitroglycerin gtt.  Assessment & Plan:   Principal Problem:   ST elevation myocardial infarction involving left circumflex coronary artery (HCC) Active Problems:   Nicotine dependence   CAD (coronary artery disease)   ST elevation MI, S/P CATH showing occlusion of the left circumflex coronary artery. S/p PCI.  Currently on NTG gtt for persistent chest pain.  Continue the aspirin, metoprolol, statin, aggrastat. Echocardiogram performed this admission on 07/15/20 revealed normal RV systolic function with mildly reduced LV systolic function, an EF of 40-45% with mild LVH, a mildly dilated aortic root, measuring 15mm and mild dilation of the ascending aorta, measuring 52mm.     Tobacco abuse:  Smoking cession given.  On nicotine patch.    Hypertension:  Well controlled.    Hyperlipidemia:  Resume statin.     DILATED AA Recommend outpatient follow up .    DVT prophylaxis: (Lovenox) Code Status: (Full code. ) Family Communication: ( family at bedside) Disposition:   Status is: Inpatient  Remains inpatient appropriate because:Ongoing diagnostic testing needed not appropriate for outpatient work up and IV treatments appropriate due to intensity of illness or inability to take PO   Dispo: The patient is from: Home              Anticipated d/c is to: Home              Anticipated d/c date is: 2 days              Patient currently  is not medically stable to d/c.   Difficult to place patient No  Level of care: ICU      Consultants:   Cardiology.   Procedures:  Antimicrobials: none.   Subjective: Reports having a headache and nausea,   Objective: Vitals:   07/16/20 0900 07/16/20 0930 07/16/20 1000 07/16/20 1100  BP: 120/72 129/86 128/86 130/89  Pulse: 62 63 62 66  Resp:      Temp:      TempSrc:      SpO2: 98% 94% 94% 95%  Weight:      Height:        Intake/Output Summary (Last 24 hours) at 07/16/2020 1228 Last data filed at 07/16/2020 0800 Gross per 24 hour  Intake 1098.58 ml  Output 2500 ml  Net -1401.42 ml   Filed Weights   07/15/20 1432 07/15/20 1630  Weight: 95 kg 95 kg    Examination:  General exam: Appears calm and comfortable  Respiratory system: Clear to auscultation. Respiratory effort normal. Cardiovascular system: S1 & S2 heard, RRR.  No pedal edema. Gastrointestinal system: Abdomen is nondistended, soft and nontender.  Normal bowel sounds heard. Central nervous system: Alert and oriented. No focal neurological deficits. Extremities: Symmetric 5 x 5 power. Skin: No rashes, lesions or ulcers Psychiatry:  Mood & affect appropriate.     Data Reviewed: I have personally reviewed following labs and imaging studies  CBC: Recent  Labs  Lab 07/15/20 1626 07/16/20 0213  WBC 20.2* 19.6*  HGB 14.4 14.2  HCT 42.9 40.4  MCV 92.9 89.4  PLT 222 212    Basic Metabolic Panel: Recent Labs  Lab 07/15/20 1626 07/16/20 0213  NA 138 139  K 3.4* 3.6  CL 104 103  CO2 24 27  GLUCOSE 140* 156*  BUN 15 15  CREATININE 0.87 0.82  CALCIUM 8.9 9.0    GFR: Estimated Creatinine Clearance: 141.1 mL/min (by C-G formula based on SCr of 0.82 mg/dL).  Liver Function Tests: No results for input(s): AST, ALT, ALKPHOS, BILITOT, PROT, ALBUMIN in the last 168 hours.  CBG: Recent Labs  Lab 07/15/20 1621  GLUCAP 149*     Recent Results (from the past 240 hour(s))  SARS Coronavirus 2  by RT PCR (hospital order, performed in [facility] hospital lab) Nasopharyngeal Nasopharyngeal Swab     Status: None   Collection Time: 07/15/20  3:18 PM   Specimen: Nasopharyngeal Swab  Result Value Ref Range Status   SARS Coronavirus 2 NEGATIVE NEGATIVE Final    Comment: (NOTE) SARS-CoV-2 target nucleic acids are NOT DETECTED.  The SARS-CoV-2 RNA is generally detectable in upper and lower respiratory specimens during the acute phase of infection. The lowest concentration of SARS-CoV-2 viral copies this assay can detect is 250 copies / mL. A negative result does not preclude SARS-CoV-2 infection and should not be used as the sole basis for treatment or other patient management decisions.  A negative result may occur with improper specimen collection / handling, submission of specimen other than nasopharyngeal swab, presence of viral mutation(s) within the areas targeted by this assay, and inadequate number of viral copies (<250 copies / mL). A negative result must be combined with clinical observations, patient history, and epidemiological information.  Fact Sheet for Patients:   BoilerBrush.com.cy  Fact Sheet for Healthcare Providers: https://pope.com/  This test is not yet approved or  cleared by the [place] FDA and has been authorized for detection and/or diagnosis of SARS-CoV-2 by FDA under an Emergency Use Authorization (EUA).  This EUA will remain in effect (meaning this test can be used) for the duration of the COVID-19 declaration under Section 564(b)(1) of the Act, 21 U.S.C. section 360bbb-3(b)(1), unless the authorization is terminated or revoked sooner.  Performed at [facility], [address] [place], [place] [address]   MRSA PCR Screening     Status: None   Collection Time: 07/15/20  4:15 PM   Specimen: Nasopharyngeal Swab  Result Value Ref Range Status   MRSA by PCR NEGATIVE NEGATIVE Final     Comment:        The GeneXpert MRSA Assay (FDA approved for NASAL specimens only), is one component of a comprehensive MRSA colonization surveillance program. It is not intended to diagnose MRSA infection nor to guide or monitor treatment for MRSA infections. Performed at [facility], [address], [place], [place] [address]          Radiology Studies: CARDIAC CATHETERIZATION  Result Date: 07/15/2020 Conclusions: 1. Severe single-vessel coronary artery disease with thrombotic occlusion of mid LCx leading to inferior/posterior STEMI. 2. Mild to moderate, nonobstructive coronary artery disease involving proximal LCx, LAD, and RCA. 3. Widely patent distal RCA stent. 4. Mildly to moderately reduced left ventricular ejection fraction with mid/apical anterolateral hypokinesis and moderately elevated filling pressure. 5. Successful PCI to mid LCx using resolute Onyx 3.5 x 18 mm drug-eluting stent with 0% residual stenosis and TIMI-3  flow other than occlusion of small distal branches due to embolization. Recommendations: 1. Dual antiplatelet therapy with aspirin and prasugrel for at least 12 months. 2. Continue tirofiban infusion for 18 hours. 3. Titrate nitroglycerin infusion for relief of chest pain.  As needed morphine is also available. 4. Aggressive secondary prevention. 5. Obtain echocardiogram to better evaluate LV systolic function. Yvonne Kendallhristopher End, MD [facility]   ECHOCARDIOGRAM COMPLETE  Result Date: 07/15/2020    ECHOCARDIOGRAM REPORT   [name] Date of Exam: 07/15/2020 Medical Rec #:  [id number]          Height:       76.0 in Accession #:    [id number]         Weight:       209.4 lb Date of Birth:  [birth year]          BSA:          2.259 m Patient Age:    44 years           BP:           116/69 mmHg Patient Gender: M                  HR:           76 bpm. Exam Location:  [facility] Procedure: 2D Echo, Cardiac Doppler and Color Doppler Indications:      I21.9 Acute Myocardial Infarction  History:         Patient has no prior history of Echocardiogram examinations.                  Risk Factors:Hypertension. Myocardial Infarction.  Sonographer:     Sedonia SmallNaTashia Rodgers-Jones Referring Phys:  [id number] CHRISTOPHER END Diagnosing Phys: Yvonne Kendallhristopher End MD IMPRESSIONS  1. Left ventricular ejection fraction, by estimation, is 40 to 45%. The left ventricle has moderately decreased function. The left ventricle demonstrates regional wall motion abnormalities (see scoring diagram/findings for description). The left ventricular internal cavity size was mildly dilated. There is mild left ventricular hypertrophy. Left ventricular diastolic parameters are consistent with Grade I diastolic dysfunction (impaired relaxation). There is severe hypokinesis of the left ventricular, basal-mid inferior wall and inferolateral wall.  2. Right ventricular systolic function is normal. The right ventricular size is normal. Tricuspid regurgitation signal is inadequate for assessing PA pressure.  3. The mitral valve is normal in structure. Trivial mitral valve regurgitation.  4. The aortic valve is tricuspid. Aortic valve regurgitation is not visualized. No aortic stenosis is present.  5. Aortic dilatation noted. There is mild dilatation of the aortic root, measuring 42 mm. There is mild dilatation of the ascending aorta, measuring 40 mm.  6. The inferior vena cava is dilated in size with >50% respiratory variability, suggesting right atrial pressure of 8 mmHg. FINDINGS  Left Ventricle: Left ventricular ejection fraction, by estimation, is 40 to 45%. The left ventricle has moderately decreased function. The left ventricle demonstrates regional wall motion abnormalities. Severe hypokinesis of the left ventricular, basal-mid inferior wall and inferolateral wall. The left ventricular internal cavity size was mildly dilated. There is mild left ventricular hypertrophy. Left ventricular diastolic parameters  are consistent with Grade I diastolic dysfunction (impaired relaxation). Right Ventricle: The right ventricular size is normal. No increase in right ventricular wall thickness. Right ventricular systolic function is normal. Tricuspid regurgitation signal is inadequate for assessing PA pressure. Left Atrium: Left atrial size was normal in size. Right Atrium: Right atrial size was normal  in size. Pericardium: There is no evidence of pericardial effusion. Mitral Valve: The mitral valve is normal in structure. Trivial mitral valve regurgitation. Tricuspid Valve: The tricuspid valve is normal in structure. Tricuspid valve regurgitation is trivial. Aortic Valve: The aortic valve is tricuspid. Aortic valve regurgitation is not visualized. No aortic stenosis is present. Pulmonic Valve: The pulmonic valve was grossly normal. Pulmonic valve regurgitation is not visualized. No evidence of pulmonic stenosis. Aorta: Aortic dilatation noted. There is mild dilatation of the aortic root, measuring 42 mm. There is mild dilatation of the ascending aorta, measuring 40 mm. Pulmonary Artery: The pulmonary artery is of normal size. Venous: The inferior vena cava is dilated in size with greater than 50% respiratory variability, suggesting right atrial pressure of 8 mmHg. IAS/Shunts: The interatrial septum was not well visualized.  LEFT VENTRICLE PLAX 2D LVIDd:         5.60 cm  Diastology LVIDs:         4.05 cm  LV e' medial:    5.77 cm/s LV PW:         1.07 cm  LV E/e' medial:  9.7 LV IVS:        1.08 cm  LV e' lateral:   8.38 cm/s LVOT diam:     2.60 cm  LV E/e' lateral: 6.6 LV SV:         83 LV SV Index:   37 LVOT Area:     5.31 cm  RIGHT VENTRICLE             IVC RV Basal diam:  3.80 cm     IVC diam: 2.09 cm RV S prime:     15.30 cm/s TAPSE (M-mode): 2.8 cm LEFT ATRIUM             Index       RIGHT ATRIUM           Index LA diam:        4.20 cm 1.86 cm/m  RA Area:     15.80 cm LA Vol (A2C):   35.5 ml 15.71 ml/m RA Volume:   47.50  ml  21.03 ml/m LA Vol (A4C):   22.5 ml 9.96 ml/m LA Biplane Vol: 30.3 ml 13.41 ml/m  AORTIC VALVE LVOT Vmax:   86.70 cm/s LVOT Vmean:  55.300 cm/s LVOT VTI:    0.157 m  AORTA Ao Root diam: 4.20 cm Ao Asc diam:  4.00 cm MITRAL VALVE MV Area (PHT): 3.03 cm    SHUNTS MV Decel Time: 250 msec    Systemic VTI:  0.16 m MV E velocity: 55.70 cm/s  Systemic Diam: 2.60 cm MV A velocity: 78.80 cm/s MV E/A ratio:  0.71 Cristal Deer End MD Electronically signed by Yvonne Kendall MD Signature Date/Time: 07/15/2020/9:06:58 PM    Final         Scheduled Meds: . aspirin  81 mg Oral Daily  . atorvastatin  80 mg Oral q1800  . Chlorhexidine Gluconate Cloth  6 each Topical Daily  . enoxaparin (LOVENOX) injection  40 mg Subcutaneous Q24H  . metoprolol tartrate  25 mg Oral BID  . nicotine  21 mg Transdermal Daily  . ondansetron (ZOFRAN) IV  4 mg Intravenous Q4H  . pneumococcal 23 valent vaccine  0.5 mL Intramuscular Tomorrow-1000  . prasugrel  10 mg Oral Daily  . sodium chloride flush  3 mL Intravenous Q12H   Continuous Infusions: . sodium chloride    . nitroGLYCERIN 30 mcg/min (07/16/20 0417)  LOS: 1 day        Kathlen Mody, MD [facility]   To contact the attending provider between 7A-7P or the covering provider during after hours 7P-7A, please log into the web site [email or web address] and access using universal [place] password for that web site. If you do not have the password, please call the hospital operator.  07/16/2020, 12:28 PM

## 2020-07-17 ENCOUNTER — Other Ambulatory Visit: Payer: Self-pay | Admitting: Internal Medicine

## 2020-07-17 MED ORDER — LISINOPRIL 2.5 MG PO TABS: 2.5000 mg | ORAL_TABLET | Freq: Every day | ORAL | 2 refills | Status: DC

## 2020-07-17 MED ORDER — NICOTINE 21 MG/24HR TD PT24: 21.0000 mg | MEDICATED_PATCH | Freq: Every day | TRANSDERMAL | 0 refills | Status: DC

## 2020-07-17 MED ORDER — METOPROLOL TARTRATE 25 MG PO TABS: 25.0000 mg | ORAL_TABLET | Freq: Two times a day (BID) | ORAL | 2 refills | Status: DC

## 2020-07-17 MED ORDER — ATORVASTATIN CALCIUM 80 MG PO TABS: 80.0000 mg | ORAL_TABLET | Freq: Every day | ORAL | 1 refills | Status: DC

## 2020-07-17 MED ORDER — PRASUGREL HCL 10 MG PO TABS: 10.0000 mg | ORAL_TABLET | Freq: Every day | ORAL | 0 refills | Status: DC

## 2020-07-17 MED ORDER — NITROGLYCERIN 0.4 MG SL SUBL: 0.4000 mg | SUBLINGUAL_TABLET | SUBLINGUAL | 12 refills | Status: DC | PRN

## 2020-07-17 MED ORDER — ASPIRIN 81 MG PO CHEW: 81.0000 mg | CHEWABLE_TABLET | Freq: Every day | ORAL | 2 refills | Status: DC

## 2020-07-17 NOTE — Consult Note (Signed)
[facility]    SUBJECTIVE: Mr. Timothy Myers is a 45 year old male with a past medical history significant for coronary artery disease with an inferior STEMI s/p PCI in 2018, hypertension, hyperlipidemia, and tobacco abuse who presented to the ED on 07/15/20 for an acute onset of chest pain.  ECG revealed inferior ST elevations with reciprocal changes and he underwent LHC with Dr. Okey Dupre.  LHC revealed an occluded mid LCx and a patent distal RCA stent, and he received PCI to the LCx with a Resolute Onyx 3.5 x 55mm DES.    He is followed in outpatient cardiology by Dr. Juliann Pares.  Echocardiogram performed this admission on 07/15/20 revealed normal RV systolic function with mildly reduced LV systolic function, an EF of 40-45% with mild LVH, a mildly dilated aortic root, measuring 17mm and mild dilation of the ascending aorta, measuring 82mm.    07/16/20: Mr. Coin reports severe chest pain/discomfort following the LHC yesterday that eventually resolved early this morning.  He's currently on a nitro drip and reports a mild headache.  He denies any shortness of breath, lower extremity swelling, orthopnea, or PND.  He denies any pain, bleeding, or drainage over right radial access site. He has been smoking since he was 45 years old, currently 2 packs a day.   07/17/20: Mr. Winfree is sitting up in bed, anxious to go home.  He denies any recurrent chest pain or chest pressure.  He denies shortness of breath, lower extremity swelling, orthopnea, or PND.  He denies syncopal or presyncopal episodes.     Vitals:   07/16/20 2341 07/16/20 2342 07/17/20 0351 07/17/20 0352  BP: (!) 124/103   122/78  Pulse: 79  76   Resp: 19   19  Temp: 98.4 F (36.9 C)   99.3 F (37.4 C)  TempSrc: Oral   Oral  SpO2: 90% 92% 93% 95%  Weight:      Height:         Intake/Output Summary (Last 24 hours) at 07/17/2020 0750 Last data filed at 07/16/2020 2000 Gross per 24 hour  Intake 1012.76 ml  Output 700 ml  Net 312.76 ml       PHYSICAL EXAM  General: Well developed, well nourished, in no acute distress HEENT:  Normocephalic and atramatic Neck:  No JVD.  Lungs: Clear bilaterally to auscultation and percussion. Heart: HRRR . Normal S1 and S2 without gallops or murmurs.  Abdomen: Bowel sounds are positive, abdomen soft and non-tender  Msk:  Back normal, normal gait. Normal strength and tone for age. Extremities: No clubbing, cyanosis or edema.  Right radial access site clean, dry, intact with no evidence of edema, erythema, or ecchymosis.  Neuro: Alert and oriented X 3. Psych:  Good affect, responds appropriately   LABS: Basic Metabolic Panel: Recent Labs    07/15/20 1626 07/16/20 0213  NA 138 139  K 3.4* 3.6  CL 104 103  CO2 24 27  GLUCOSE 140* 156*  BUN 15 15  CREATININE 0.87 0.82  CALCIUM 8.9 9.0   Liver Function Tests: No results for input(s): AST, ALT, ALKPHOS, BILITOT, PROT, ALBUMIN in the last 72 hours. No results for input(s): LIPASE, AMYLASE in the last 72 hours. CBC: Recent Labs    07/15/20 1626 07/16/20 0213  WBC 20.2* 19.6*  HGB 14.4 14.2  HCT 42.9 40.4  MCV 92.9 89.4  PLT 222 212   Cardiac Enzymes: No results for input(s): CKTOTAL, CKMB, CKMBINDEX, TROPONINI in the last 72 hours. BNP: Invalid input(s):  POCBNP D-Dimer: No results for input(s): DDIMER in the last 72 hours. Hemoglobin A1C: No results for input(s): HGBA1C in the last 72 hours. Fasting Lipid Panel: No results for input(s): CHOL, HDL, LDLCALC, TRIG, CHOLHDL, LDLDIRECT in the last 72 hours. Thyroid Function Tests: No results for input(s): TSH, T4TOTAL, T3FREE, THYROIDAB in the last 72 hours.  Invalid input(s): FREET3 Anemia Panel: No results for input(s): VITAMINB12, FOLATE, FERRITIN, TIBC, IRON, RETICCTPCT in the last 72 hours.  CARDIAC CATHETERIZATION  Result Date: 07/15/2020 Conclusions: 1. Severe single-vessel coronary artery disease with thrombotic occlusion of mid LCx leading to  inferior/posterior STEMI. 2. Mild to moderate, nonobstructive coronary artery disease involving proximal LCx, LAD, and RCA. 3. Widely patent distal RCA stent. 4. Mildly to moderately reduced left ventricular ejection fraction with mid/apical anterolateral hypokinesis and moderately elevated filling pressure. 5. Successful PCI to mid LCx using resolute Onyx 3.5 x 18 mm drug-eluting stent with 0% residual stenosis and TIMI-3 flow other than occlusion of small distal branches due to embolization. Recommendations: 1. Dual antiplatelet therapy with aspirin and prasugrel for at least 12 months. 2. Continue tirofiban infusion for 18 hours. 3. Titrate nitroglycerin infusion for relief of chest pain.  As needed morphine is also available. 4. Aggressive secondary prevention. 5. Obtain echocardiogram to better evaluate LV systolic function. Yvonne Kendall, MD [facility]   ECHOCARDIOGRAM COMPLETE  Result Date: 07/15/2020    ECHOCARDIOGRAM REPORT   [name] Date of Exam: 07/15/2020 Medical Rec #:  [id number]          Height:       76.0 in Accession #:    [id number]         Weight:       209.4 lb Date of Birth:  01/31/1976          BSA:          2.259 m Patient Age:    45 years           BP:           116/69 mmHg Patient Gender: M                  HR:           76 bpm. Exam Location:  [facility] Procedure: 2D Echo, Cardiac Doppler and Color Doppler Indications:     I21.9 Acute Myocardial Infarction  History:         Patient has no prior history of Echocardiogram examinations.                  Risk Factors:Hypertension. Myocardial Infarction.  Sonographer:     Sedonia Small Rodgers-Jones Referring Phys:  [id number] CHRISTOPHER END Diagnosing Phys: Yvonne Kendall MD IMPRESSIONS  1. Left ventricular ejection fraction, by estimation, is 40 to 45%. The left ventricle has moderately decreased function. The left ventricle demonstrates regional wall motion abnormalities (see scoring diagram/findings for description). The  left ventricular internal cavity size was mildly dilated. There is mild left ventricular hypertrophy. Left ventricular diastolic parameters are consistent with Grade I diastolic dysfunction (impaired relaxation). There is severe hypokinesis of the left ventricular, basal-mid inferior wall and inferolateral wall.  2. Right ventricular systolic function is normal. The right ventricular size is normal. Tricuspid regurgitation signal is inadequate for assessing PA pressure.  3. The mitral valve is normal in structure. Trivial mitral valve regurgitation.  4. The aortic valve is tricuspid. Aortic valve regurgitation is not visualized. No aortic stenosis is present.  5. Aortic dilatation noted. There is mild dilatation of the aortic root, measuring 42 mm. There is mild dilatation of the ascending aorta, measuring 40 mm.  6. The inferior vena cava is dilated in size with >50% respiratory variability, suggesting right atrial pressure of 8 mmHg. FINDINGS  Left Ventricle: Left ventricular ejection fraction, by estimation, is 40 to 45%. The left ventricle has moderately decreased function. The left ventricle demonstrates regional wall motion abnormalities. Severe hypokinesis of the left ventricular, basal-mid inferior wall and inferolateral wall. The left ventricular internal cavity size was mildly dilated. There is mild left ventricular hypertrophy. Left ventricular diastolic parameters are consistent with Grade I diastolic dysfunction (impaired relaxation). Right Ventricle: The right ventricular size is normal. No increase in right ventricular wall thickness. Right ventricular systolic function is normal. Tricuspid regurgitation signal is inadequate for assessing PA pressure. Left Atrium: Left atrial size was normal in size. Right Atrium: Right atrial size was normal in size. Pericardium: There is no evidence of pericardial effusion. Mitral Valve: The mitral valve is normal in structure. Trivial mitral valve regurgitation.  Tricuspid Valve: The tricuspid valve is normal in structure. Tricuspid valve regurgitation is trivial. Aortic Valve: The aortic valve is tricuspid. Aortic valve regurgitation is not visualized. No aortic stenosis is present. Pulmonic Valve: The pulmonic valve was grossly normal. Pulmonic valve regurgitation is not visualized. No evidence of pulmonic stenosis. Aorta: Aortic dilatation noted. There is mild dilatation of the aortic root, measuring 42 mm. There is mild dilatation of the ascending aorta, measuring 40 mm. Pulmonary Artery: The pulmonary artery is of normal size. Venous: The inferior vena cava is dilated in size with greater than 50% respiratory variability, suggesting right atrial pressure of 8 mmHg. IAS/Shunts: The interatrial septum was not well visualized.  LEFT VENTRICLE PLAX 2D LVIDd:         5.60 cm  Diastology LVIDs:         4.05 cm  LV e' medial:    5.77 cm/s LV PW:         1.07 cm  LV E/e' medial:  9.7 LV IVS:        1.08 cm  LV e' lateral:   8.38 cm/s LVOT diam:     2.60 cm  LV E/e' lateral: 6.6 LV SV:         83 LV SV Index:   37 LVOT Area:     5.31 cm  RIGHT VENTRICLE             IVC RV Basal diam:  3.80 cm     IVC diam: 2.09 cm RV S prime:     15.30 cm/s TAPSE (M-mode): 2.8 cm LEFT ATRIUM             Index       RIGHT ATRIUM           Index LA diam:        4.20 cm 1.86 cm/m  RA Area:     15.80 cm LA Vol (A2C):   35.5 ml 15.71 ml/m RA Volume:   47.50 ml  21.03 ml/m LA Vol (A4C):   22.5 ml 9.96 ml/m LA Biplane Vol: 30.3 ml 13.41 ml/m  AORTIC VALVE LVOT Vmax:   86.70 cm/s LVOT Vmean:  55.300 cm/s LVOT VTI:    0.157 m  AORTA Ao Root diam: 4.20 cm Ao Asc diam:  4.00 cm MITRAL VALVE MV Area (PHT): 3.03 cm    SHUNTS MV Decel Time: 250 msec  Systemic VTI:  0.16 m MV E velocity: 55.70 cm/s  Systemic Diam: 2.60 cm MV A velocity: 78.80 cm/s MV E/A ratio:  0.71 Yvonne Kendall MD Electronically signed by Yvonne Kendall MD Signature Date/Time: 07/15/2020/9:06:58 PM    Final      Echo:  Moderately reduced LV systolic function, an EF of 40-45% with a mildly dilated aortic root and mildly dilated ascending aorta   TELEMETRY: Normal sinus rhythm   ASSESSMENT AND PLAN:  Principal Problem:   ST elevation myocardial infarction involving left circumflex coronary artery (HCC) Active Problems:   Nicotine dependence   CAD (coronary artery disease)    1.  STEMI              -S/p PCI to the left circumflex              -Will need to remain on dual antiplatelet therapy of aspirin 81mg  and prasugrel 10mg  daily for at least 1 year             -Continue metoprolol tartrate 25mg  BID, lisinopril 2.5mg  daily, and atorvastatin 80mg  daily   2.  Hypertension              -Currently normotensive on metoprolol tartrate 25mg  BID and lisinopril 2.5mg    3.  Hyperlipidemia              -Continue atorvastatin 80mg  daily   4.  Tobacco abuse              -Patient has been smoking since he was 45 years old, currently smoking 2 packs a day; stressed the importance of smoking cessation              -Patch prescribed   5.  Dilated aortic root/dilated ascending aorta              -Will need to be followed closely in an outpatient setting   [facility] Discharge   Aspirin prescribed at discharge:  Yes  High Intensity Statin Prescribed? (Lipitor 40-80mg  or Crestor 20-40mg ): Yes  Beta Blocker Prescribed: Yes  ADP Receptor Inhibitor Prescribed? (i.e. Plavix etc.-Includes Medically Managed Patients): Yes  ACEI/ARB Prescribed? (If NO document contraindications)  Yes  Aldosterone Inhibitor Prescribed? No: Will address in outpatient setting   Was EF assessed during THIS hospitalization? Yes  (YES = Measured in current episode of care or document plan to evaluate after discharge.)  Was EF < 40% ? No: 40-45%  Was Cardiac Rehab II ordered? (Includes Medically managed Patients): Yes  Was Smoking Cessation Advice provided?  Yes   The history, physical exam findings, and plan of care were all  discussed with Dr. , and all decision making was made in collaboration.    PA-C 07/17/2020 7:50 AM

## 2020-07-17 NOTE — Discharge Summary (Signed)
Physician Discharge Summary  Timothy Myers ZMO:[id number] DOB: [birth year] DOA: 07/15/2020  PCP: Timothy Myers  Admit date: 07/15/2020 Discharge date: 07/17/2020  Admitted From: Home.  Disposition:  Home.   Recommendations for Outpatient Follow-up:  1. Follow up with PCP in 1-2 weeks 2. Please obtain BMP/CBC in one week Please follow up with cardiology as recommended.   Discharge Condition: stable.  CODE STATUS:FULL CODE.  Diet recommendation: Heart Healthy  Brief/Interim Summary:  45 year old gentleman prior history of coronary artery disease s/p PCI tobacco abuse comes in with chest pain.  Twelve-lead EKG showed inferior ST elevation MI.  Patient was emergently taken to Cath Lab, showing occluded mid left circumflex and patent distal RCA stent.  He underwent PCI to the left circumflex.  Echocardiogram performed this admission on 07/15/20 revealed normal RV systolic function with mildly reduced LV systolic function, an EF of 40-45% with mild LVH, a mildly dilated aortic root, measuring 58mm and mild dilation of the ascending aorta, measuring 14mm  Discharge Diagnoses:  Principal Problem:   ST elevation myocardial infarction involving left circumflex coronary artery (HCC) Active Problems:   Nicotine dependence   CAD (coronary artery disease)  STEMI: S/p PCI to the left circumflex . Will need to remain on dual antiplatelet therapy of aspirin 81mg  and prasugrel 10mg  daily for at least 1 year cardiology recommends. Continue metoprolol tartrate 25mg  BID, lisinopril 2.5mg  daily, and atorvastatin 80mg  daily    Hypertension:  Well controlled.   Hyperlipidemia:  Resume statin.   Dilated AA/ Aortic root:  Recommend outpatient follow up with cardiology.   Discharge Instructions  Discharge Instructions    AMB Referral to Cardiac Rehabilitation - Phase II   Complete by: As directed    Diagnosis:  Coronary Stents STEMI     After initial evaluation and  assessments completed: Virtual Based Care may be provided alone or in conjunction with Phase 2 Cardiac Rehab based on patient barriers.: Yes   Diet - low sodium heart healthy   Complete by: As directed    Discharge instructions   Complete by: As directed    Please follow up with cardiology as recommended.     Allergies as of 07/17/2020      Reactions   Penicillins Anaphylaxis      Medication List    STOP taking these medications   clopidogrel 75 MG tablet Commonly known as: PLAVIX   meloxicam 15 MG tablet Commonly known as: MOBIC   ticagrelor 90 MG Tabs tablet Commonly known as: BRILINTA     TAKE these medications   aspirin 81 MG chewable tablet Chew 1 tablet (81 mg total) by mouth daily.   atorvastatin 80 MG tablet Commonly known as: LIPITOR Take 1 tablet (80 mg total) by mouth daily at 6 PM.   lisinopril 2.5 MG tablet Commonly known as: ZESTRIL Take 1 tablet (2.5 mg total) by mouth daily.   metoprolol tartrate 25 MG tablet Commonly known as: LOPRESSOR Take 1 tablet (25 mg total) by mouth 2 (two) times daily.   nicotine 21 mg/24hr patch Commonly known as: NICODERM CQ - dosed in mg/24 hours Place 1 patch (21 mg total) onto the skin daily.   nitroGLYCERIN 0.4 MG SL tablet Commonly known as: NITROSTAT Place 1 tablet (0.4 mg total) under the tongue every 5 (five) minutes as needed for chest pain.   prasugrel 10 MG Tabs tablet Commonly known as: EFFIENT Take 1 tablet (10 mg total) by mouth daily.  Follow-up Information    Callwood, Bobbie Stackwayne D, MD In 1 week.   Specialties: Cardiology, Internal Medicine Why: Fri. Feb 4 at 1000 am Contact information: [address] [place] [address] [phone]              Allergies  Allergen Reactions  . Penicillins Anaphylaxis    Consultations:  Cardiology.    Procedures/Studies: CARDIAC CATHETERIZATION  Result Date: 07/15/2020 Conclusions: 1. Severe single-vessel coronary artery disease  with thrombotic occlusion of mid LCx leading to inferior/posterior STEMI. 2. Mild to moderate, nonobstructive coronary artery disease involving proximal LCx, LAD, and RCA. 3. Widely patent distal RCA stent. 4. Mildly to moderately reduced left ventricular ejection fraction with mid/apical anterolateral hypokinesis and moderately elevated filling pressure. 5. Successful PCI to mid LCx using resolute Onyx 3.5 x 18 mm drug-eluting stent with 0% residual stenosis and TIMI-3 flow other than occlusion of small distal branches due to embolization. Recommendations: 1. Dual antiplatelet therapy with aspirin and prasugrel for at least 12 months. 2. Continue tirofiban infusion for 18 hours. 3. Titrate nitroglycerin infusion for relief of chest pain.  As needed morphine is also available. 4. Aggressive secondary prevention. 5. Obtain echocardiogram to better evaluate LV systolic function. Yvonne Kendallhristopher End, MD [facility]   ECHOCARDIOGRAM COMPLETE  Result Date: 07/15/2020    ECHOCARDIOGRAM REPORT   [name] Date of Exam: 07/15/2020 Medical Rec #:  [id number]          Height:       76.0 in Accession #:    [id number]         Weight:       209.4 lb Date of Birth:  Jul 25, 1975          BSA:          2.259 m Patient Age:    45 years           BP:           116/69 mmHg Patient Gender: M                  HR:           76 bpm. Exam Location:  [facility] Procedure: 2D Echo, Cardiac Doppler and Color Doppler Indications:     I21.9 Acute Myocardial Infarction  History:         Patient has no prior history of Echocardiogram examinations.                  Risk Factors:Hypertension. Myocardial Infarction.  Sonographer:     Sedonia SmallNaTashia Rodgers-Jones Referring Phys:  [id number] CHRISTOPHER END Diagnosing Phys: Yvonne Kendallhristopher End MD IMPRESSIONS  1. Left ventricular ejection fraction, by estimation, is 40 to 45%. The left ventricle has moderately decreased function. The left ventricle demonstrates regional wall motion abnormalities  (see scoring diagram/findings for description). The left ventricular internal cavity size was mildly dilated. There is mild left ventricular hypertrophy. Left ventricular diastolic parameters are consistent with Grade I diastolic dysfunction (impaired relaxation). There is severe hypokinesis of the left ventricular, basal-mid inferior wall and inferolateral wall.  2. Right ventricular systolic function is normal. The right ventricular size is normal. Tricuspid regurgitation signal is inadequate for assessing PA pressure.  3. The mitral valve is normal in structure. Trivial mitral valve regurgitation.  4. The aortic valve is tricuspid. Aortic valve regurgitation is not visualized. No aortic stenosis is present.  5. Aortic dilatation noted. There is mild dilatation of the aortic root, measuring 42  mm. There is mild dilatation of the ascending aorta, measuring 40 mm.  6. The inferior vena cava is dilated in size with >50% respiratory variability, suggesting right atrial pressure of 8 mmHg. FINDINGS  Left Ventricle: Left ventricular ejection fraction, by estimation, is 40 to 45%. The left ventricle has moderately decreased function. The left ventricle demonstrates regional wall motion abnormalities. Severe hypokinesis of the left ventricular, basal-mid inferior wall and inferolateral wall. The left ventricular internal cavity size was mildly dilated. There is mild left ventricular hypertrophy. Left ventricular diastolic parameters are consistent with Grade I diastolic dysfunction (impaired relaxation). Right Ventricle: The right ventricular size is normal. No increase in right ventricular wall thickness. Right ventricular systolic function is normal. Tricuspid regurgitation signal is inadequate for assessing PA pressure. Left Atrium: Left atrial size was normal in size. Right Atrium: Right atrial size was normal in size. Pericardium: There is no evidence of pericardial effusion. Mitral Valve: The mitral valve is normal  in structure. Trivial mitral valve regurgitation. Tricuspid Valve: The tricuspid valve is normal in structure. Tricuspid valve regurgitation is trivial. Aortic Valve: The aortic valve is tricuspid. Aortic valve regurgitation is not visualized. No aortic stenosis is present. Pulmonic Valve: The pulmonic valve was grossly normal. Pulmonic valve regurgitation is not visualized. No evidence of pulmonic stenosis. Aorta: Aortic dilatation noted. There is mild dilatation of the aortic root, measuring 42 mm. There is mild dilatation of the ascending aorta, measuring 40 mm. Pulmonary Artery: The pulmonary artery is of normal size. Venous: The inferior vena cava is dilated in size with greater than 50% respiratory variability, suggesting right atrial pressure of 8 mmHg. IAS/Shunts: The interatrial septum was not well visualized.  LEFT VENTRICLE PLAX 2D LVIDd:         5.60 cm  Diastology LVIDs:         4.05 cm  LV e' medial:    5.77 cm/s LV PW:         1.07 cm  LV E/e' medial:  9.7 LV IVS:        1.08 cm  LV e' lateral:   8.38 cm/s LVOT diam:     2.60 cm  LV E/e' lateral: 6.6 LV SV:         83 LV SV Index:   37 LVOT Area:     5.31 cm  RIGHT VENTRICLE             IVC RV Basal diam:  3.80 cm     IVC diam: 2.09 cm RV S prime:     15.30 cm/s TAPSE (M-mode): 2.8 cm LEFT ATRIUM             Index       RIGHT ATRIUM           Index LA diam:        4.20 cm 1.86 cm/m  RA Area:     15.80 cm LA Vol (A2C):   35.5 ml 15.71 ml/m RA Volume:   47.50 ml  21.03 ml/m LA Vol (A4C):   22.5 ml 9.96 ml/m LA Biplane Vol: 30.3 ml 13.41 ml/m  AORTIC VALVE LVOT Vmax:   86.70 cm/s LVOT Vmean:  55.300 cm/s LVOT VTI:    0.157 m  AORTA Ao Root diam: 4.20 cm Ao Asc diam:  4.00 cm MITRAL VALVE MV Area (PHT): 3.03 cm    SHUNTS MV Decel Time: 250 msec    Systemic VTI:  0.16 m MV E velocity: 55.70 cm/s  Systemic Diam:  2.60 cm MV A velocity: 78.80 cm/s MV E/A ratio:  0.71 Yvonne Kendall MD Electronically signed by Yvonne Kendall MD Signature Date/Time:  07/15/2020/9:06:58 PM    Final      Subjective: No new complaints.   Discharge Exam: Vitals:   07/17/20 0352 07/17/20 0804  BP: 122/78 116/90  Pulse:    Resp: 19 18  Temp: 99.3 F (37.4 C) 97.6 F (36.4 C)  SpO2: 95% 100%   Vitals:   07/16/20 2342 07/17/20 0351 07/17/20 0352 07/17/20 0804  BP:   122/78 116/90  Pulse:  76    Resp:   19 18  Temp:   99.3 F (37.4 C) 97.6 F (36.4 C)  TempSrc:   Oral Oral  SpO2: 92% 93% 95% 100%  Weight:      Height:        General: Pt is alert, awake, not in acute distress Cardiovascular: RRR, S1/S2 +, no rubs, no gallops Respiratory: CTA bilaterally, no wheezing, no rhonchi Abdominal: Soft, NT, ND, bowel sounds + Extremities: no edema, no cyanosis    The results of significant diagnostics from this hospitalization (including imaging, microbiology, ancillary and laboratory) are listed below for reference.     Microbiology: Recent Results (from the past 240 hour(s))  SARS Coronavirus 2 by RT PCR (hospital order, performed in [facility] hospital lab) Nasopharyngeal Nasopharyngeal Swab     Status: None   Collection Time: 07/15/20  3:18 PM   Specimen: Nasopharyngeal Swab  Result Value Ref Range Status   SARS Coronavirus 2 NEGATIVE NEGATIVE Final    Comment: (NOTE) SARS-CoV-2 target nucleic acids are NOT DETECTED.  The SARS-CoV-2 RNA is generally detectable in upper and lower respiratory specimens during the acute phase of infection. The lowest concentration of SARS-CoV-2 viral copies this assay can detect is 250 copies / mL. A negative result does not preclude SARS-CoV-2 infection and should not be used as the sole basis for treatment or other patient management decisions.  A negative result may occur with improper specimen collection / handling, submission of specimen other than nasopharyngeal swab, presence of viral mutation(s) within the areas targeted by this assay, and inadequate number of viral copies (<250 copies / mL). A  negative result must be combined with clinical observations, patient history, and epidemiological information.  Fact Sheet for Patients:   BoilerBrush.com.cy  Fact Sheet for Healthcare Providers: https://pope.com/  This test is not yet approved or  cleared by the [place] FDA and has been authorized for detection and/or diagnosis of SARS-CoV-2 by FDA under an Emergency Use Authorization (EUA).  This EUA will remain in effect (meaning this test can be used) for the duration of the COVID-19 declaration under Section 564(b)(1) of the Act, 21 U.S.C. section 360bbb-3(b)(1), unless the authorization is terminated or revoked sooner.  Performed at [facility], [address] [place], [place] [address]   MRSA PCR Screening     Status: None   Collection Time: 07/15/20  4:15 PM   Specimen: Nasopharyngeal Swab  Result Value Ref Range Status   MRSA by PCR NEGATIVE NEGATIVE Final    Comment:        The GeneXpert MRSA Assay (FDA approved for NASAL specimens only), is one component of a comprehensive MRSA colonization surveillance program. It is not intended to diagnose MRSA infection nor to guide or monitor treatment for MRSA infections. Performed at [facility], [address] [place], [place] [address]      Labs: BNP (last 3 results)  No results for input(s): BNP in the last 8760 hours. Basic Metabolic Panel: Recent Labs  Lab 07/15/20 1626 07/16/20 0213  NA 138 139  K 3.4* 3.6  CL 104 103  CO2 24 27  GLUCOSE 140* 156*  BUN 15 15  CREATININE 0.87 0.82  CALCIUM 8.9 9.0   Liver Function Tests: No results for input(s): AST, ALT, ALKPHOS, BILITOT, PROT, ALBUMIN in the last 168 hours. No results for input(s): LIPASE, AMYLASE in the last 168 hours. No results for input(s): AMMONIA in the last 168 hours. CBC: Recent Labs  Lab 07/15/20 1626 07/16/20 0213  WBC 20.2* 19.6*  HGB 14.4 14.2  HCT 42.9  40.4  MCV 92.9 89.4  PLT 222 212   Cardiac Enzymes: No results for input(s): CKTOTAL, CKMB, CKMBINDEX, TROPONINI in the last 168 hours. BNP: Invalid input(s): POCBNP CBG: Recent Labs  Lab 07/15/20 1621  GLUCAP 149*   D-Dimer No results for input(s): DDIMER in the last 72 hours. Hgb A1c No results for input(s): HGBA1C in the last 72 hours. Lipid Profile No results for input(s): CHOL, HDL, LDLCALC, TRIG, CHOLHDL, LDLDIRECT in the last 72 hours. Thyroid function studies No results for input(s): TSH, T4TOTAL, T3FREE, THYROIDAB in the last 72 hours.  Invalid input(s): FREET3 Anemia work up No results for input(s): VITAMINB12, FOLATE, FERRITIN, TIBC, IRON, RETICCTPCT in the last 72 hours. Urinalysis No results found for: COLORURINE, APPEARANCEUR, LABSPEC, PHURINE, GLUCOSEU, HGBUR, BILIRUBINUR, KETONESUR, PROTEINUR, UROBILINOGEN, NITRITE, LEUKOCYTESUR Sepsis Labs Invalid input(s): PROCALCITONIN,  WBC,  LACTICIDVEN Microbiology Recent Results (from the past 240 hour(s))  SARS Coronavirus 2 by RT PCR (hospital order, performed in [facility] hospital lab) Nasopharyngeal Nasopharyngeal Swab     Status: None   Collection Time: 07/15/20  3:18 PM   Specimen: Nasopharyngeal Swab  Result Value Ref Range Status   SARS Coronavirus 2 NEGATIVE NEGATIVE Final    Comment: (NOTE) SARS-CoV-2 target nucleic acids are NOT DETECTED.  The SARS-CoV-2 RNA is generally detectable in upper and lower respiratory specimens during the acute phase of infection. The lowest concentration of SARS-CoV-2 viral copies this assay can detect is 250 copies / mL. A negative result does not preclude SARS-CoV-2 infection and should not be used as the sole basis for treatment or other patient management decisions.  A negative result may occur with improper specimen collection / handling, submission of specimen other than nasopharyngeal swab, presence of viral mutation(s) within the areas targeted by this assay, and  inadequate number of viral copies (<250 copies / mL). A negative result must be combined with clinical observations, patient history, and epidemiological information.  Fact Sheet for Patients:   BoilerBrush.com.cy  Fact Sheet for Healthcare Providers: https://pope.com/  This test is not yet approved or  cleared by the [place] FDA and has been authorized for detection and/or diagnosis of SARS-CoV-2 by FDA under an Emergency Use Authorization (EUA).  This EUA will remain in effect (meaning this test can be used) for the duration of the COVID-19 declaration under Section 564(b)(1) of the Act, 21 U.S.C. section 360bbb-3(b)(1), unless the authorization is terminated or revoked sooner.  Performed at [facility], [address] [place], [place] [address]   MRSA PCR Screening     Status: None   Collection Time: 07/15/20  4:15 PM   Specimen: Nasopharyngeal Swab  Result Value Ref Range Status   MRSA by PCR NEGATIVE NEGATIVE Final    Comment:        The GeneXpert MRSA Assay (FDA approved  for NASAL specimens only), is one component of a comprehensive MRSA colonization surveillance program. It is not intended to diagnose MRSA infection nor to guide or monitor treatment for MRSA infections. Performed at [facility], [address], [place], [place] [address]      Time coordinating discharge: 36 minutes.   SIGNED:   Kathlen Mody, MD  [facility]

## 2020-07-17 NOTE — Progress Notes (Signed)
AVS discussed thoroughly with pt and significant other. Young Berry, case Production designer, theatre/television/film, discussed prescription availability. All questions answered. Pt feels safe for d/c. Denies any pain. Pt d/c with nicotine patch in place - was educated and instructed to remove after 24 hours.

## 2020-07-17 NOTE — TOC Transition Note (Signed)
Transition of Care [facility]) - CM/SW Discharge Note   Patient Details  Name: Timothy Myers MRN: [id number] Date of Birth: [birth year]  [facility]) CM/SW Contact:  Marina Goodell Phone Number: [phone] 07/17/2020, 11:41 AM   Clinical Narrative:     Patient will d/c home w/ self-care.  CSW gave patient [facility] applications.  CSW referred [facility] to [facility]  CSW went over the application and told the patient to make sure he answered the phone when the [facility] representative called.  Patient verbalize understanding. TOC consult completed.        Patient Goals and CMS Choice        Discharge Placement                       Discharge Plan and Services                                     Social Determinants of Health (SDOH) Interventions     Readmission Risk Interventions No flowsheet data found.

## 2020-07-24 ENCOUNTER — Telehealth: Payer: Self-pay | Admitting: Pharmacy Technician

## 2020-07-24 NOTE — Telephone Encounter (Signed)
Patient received a 30 day supply of medication.  Provided patient with new patient packet to obtain ongoing [facility] services.  [facility] must receive requested financial documentation within 30 days in order to determine eligibility and provide additional medication assistance.  Asusena Sigley J. Lajean Boese Care Manager [facility] 

## 2020-07-29 ENCOUNTER — Telehealth: Payer: Self-pay | Admitting: General Practice

## 2020-07-29 NOTE — Telephone Encounter (Signed)
lmom to inform patient that the [facility] will need a completed application with supporting documents that was given to him in the hospital to determine eligibility and move forward in the scheduling process.

## 2020-10-24 ENCOUNTER — Telehealth: Payer: Self-pay | Admitting: Pharmacist

## 2020-10-24 NOTE — Telephone Encounter (Signed)
Patient failed to provide requested 2022 financial documentation. Unable to determine patient's eligibility status for [facility]. No additional medication assistance will be provided by [facility] without the required proof of income documentation. Patient notified by letter.  [facility] Administrative Assistant 

## 2021-02-19 ENCOUNTER — Emergency Department: Payer: Self-pay

## 2021-02-19 ENCOUNTER — Emergency Department
Admission: EM | Admit: 2021-02-19 | Discharge: 2021-02-19 | Disposition: A | Discharge status: Home / Self Care | Payer: Self-pay | Attending: Emergency Medicine | Admitting: Emergency Medicine

## 2021-02-19 ENCOUNTER — Other Ambulatory Visit: Payer: Self-pay

## 2021-02-19 DIAGNOSIS — K529 Noninfective gastroenteritis and colitis, unspecified: Secondary | ICD-10-CM | POA: Insufficient documentation

## 2021-02-19 DIAGNOSIS — Z7982 Long term (current) use of aspirin: Secondary | ICD-10-CM | POA: Insufficient documentation

## 2021-02-19 DIAGNOSIS — I10 Essential (primary) hypertension: Secondary | ICD-10-CM | POA: Insufficient documentation

## 2021-02-19 DIAGNOSIS — Z20822 Contact with and (suspected) exposure to covid-19: Secondary | ICD-10-CM | POA: Insufficient documentation

## 2021-02-19 DIAGNOSIS — I2510 Atherosclerotic heart disease of native coronary artery without angina pectoris: Secondary | ICD-10-CM | POA: Insufficient documentation

## 2021-02-19 DIAGNOSIS — Z79899 Other long term (current) drug therapy: Secondary | ICD-10-CM | POA: Insufficient documentation

## 2021-02-19 DIAGNOSIS — F17210 Nicotine dependence, cigarettes, uncomplicated: Secondary | ICD-10-CM | POA: Insufficient documentation

## 2021-02-19 LAB — TROPONIN I (HIGH SENSITIVITY)
Troponin I (High Sensitivity): 11 ng/L (ref <18)
Troponin I (High Sensitivity): 11 ng/L (ref <18)

## 2021-02-19 LAB — CBC
HCT: 43.4 % (ref 39.0–52.0)
Hemoglobin: 15.7 g/dL (ref 13.0–17.0)
MCH: 32.2 pg (ref 26.0–34.0)
MCHC: 36.2 g/dL — ABNORMAL HIGH (ref 30.0–36.0)
MCV: 88.9 fL (ref 80.0–100.0)
Platelets: 201 10*3/uL (ref 150–400)
RBC: 4.88 MIL/uL (ref 4.22–5.81)
RDW: 12.9 % (ref 11.5–15.5)
WBC: 10.1 10*3/uL (ref 4.0–10.5)
nRBC: 0 % (ref 0.0–0.2)

## 2021-02-19 LAB — PROTIME-INR
INR: 1 (ref 0.8–1.2)
Prothrombin Time: 12.9 seconds (ref 11.4–15.2)

## 2021-02-19 LAB — BASIC METABOLIC PANEL
Anion gap: 10 (ref 5–15)
BUN: 16 mg/dL (ref 6–20)
CO2: 26 mmol/L (ref 22–32)
Calcium: 9.5 mg/dL (ref 8.9–10.3)
Chloride: 99 mmol/L (ref 98–111)
Creatinine, Ser: 0.96 mg/dL (ref 0.61–1.24)
GFR, Estimated: >60 mL/min (ref >60)
Glucose, Bld: 161 mg/dL — ABNORMAL HIGH (ref 70–99)
Potassium: 3.6 mmol/L (ref 3.5–5.1)
Sodium: 135 mmol/L (ref 135–145)

## 2021-02-19 LAB — HEPATIC FUNCTION PANEL
ALT: 28 U/L (ref 0–44)
AST: 25 U/L (ref 15–41)
Albumin: 4.1 g/dL (ref 3.5–5.0)
Alkaline Phosphatase: 119 U/L (ref 38–126)
Bilirubin, Direct: <0.1 mg/dL (ref 0.0–0.2)
Total Bilirubin: 0.9 mg/dL (ref 0.3–1.2)
Total Protein: 7.1 g/dL (ref 6.5–8.1)

## 2021-02-19 LAB — RESP PANEL BY RT-PCR (FLU A&B, COVID) ARPGX2
Influenza A by PCR: NEGATIVE
Influenza B by PCR: NEGATIVE
SARS Coronavirus 2 by RT PCR: NEGATIVE

## 2021-02-19 LAB — LIPASE, BLOOD: Lipase: 27 U/L (ref 11–51)

## 2021-02-19 MED ORDER — SODIUM CHLORIDE 0.9 % IV BOLUS
1000.0000 mL | Freq: Once | INTRAVENOUS | Status: AC
  Administered 2021-02-19: 1000 mL via INTRAVENOUS

## 2021-02-19 MED ORDER — LOPERAMIDE HCL 2 MG PO CAPS
4.0000 mg | ORAL_CAPSULE | Freq: Once | ORAL | Status: AC
  Administered 2021-02-19: 4 mg via ORAL
  Filled 2021-02-19: qty 2

## 2021-02-19 MED ORDER — ONDANSETRON 4 MG PO TBDP: 4.0000 mg | ORAL_TABLET | Freq: Three times a day (TID) | ORAL | 0 refills | Status: DC | PRN

## 2021-02-19 MED ORDER — METOCLOPRAMIDE HCL 5 MG/ML IJ SOLN
10.0000 mg | Freq: Once | INTRAMUSCULAR | Status: AC
  Administered 2021-02-19: 10 mg via INTRAVENOUS
  Filled 2021-02-19: qty 2

## 2021-02-19 MED ORDER — ONDANSETRON HCL 4 MG/2ML IJ SOLN
4.0000 mg | Freq: Once | INTRAMUSCULAR | Status: AC
  Administered 2021-02-19: 4 mg via INTRAVENOUS
  Filled 2021-02-19: qty 2

## 2021-02-19 NOTE — ED Provider Notes (Signed)
[facility] Emergency Department Provider Note  ____________________________________________   Event Date/Time   First MD Initiated Contact with Patient 02/19/21 1144     (approximate)  I have reviewed the triage vital signs and the nursing notes.   HISTORY  Chief Complaint Emesis    HPI Timothy Myers is a 45 y.o. male presents emergency department complaining of vomiting and diarrhea along with fever and chills.  Patient states symptoms for 2 days.  States the diarrhea has been very watery and he has had more than 4 episodes today.  Still very nauseated.  He denies any chest pain or shortness of breath.  Past Medical History:  Diagnosis Date   Hypertension    MI (myocardial infarction) [facility])     Patient Active Problem List   Diagnosis Date Noted   STEMI (ST elevation myocardial infarction) (HCC) 07/15/2020   Nicotine dependence 07/15/2020   CAD (coronary artery disease) 07/15/2020   ST elevation myocardial infarction involving left circumflex coronary artery (HCC) 01/21/2017    Past Surgical History:  Procedure Laterality Date   CORONARY/GRAFT ACUTE MI REVASCULARIZATION N/A 01/21/2017   Procedure: Coronary/Graft Acute MI Revascularization;  Surgeon: Alwyn Pea, MD;  Location: [facility] INVASIVE CV LAB;  Service: Cardiovascular;  Laterality: N/A;   CORONARY/GRAFT ACUTE MI REVASCULARIZATION N/A 07/15/2020   Procedure: Coronary/Graft Acute MI Revascularization;  Surgeon: Yvonne Kendall, MD;  Location: [facility] INVASIVE CV LAB;  Service: Cardiovascular;  Laterality: N/A;   LEFT HEART CATH AND CORONARY ANGIOGRAPHY N/A 01/21/2017   Procedure: LEFT HEART CATH AND CORONARY ANGIOGRAPHY;  Surgeon: Alwyn Pea, MD;  Location: [facility] INVASIVE CV LAB;  Service: Cardiovascular;  Laterality: N/A;   LEFT HEART CATH AND CORONARY ANGIOGRAPHY N/A 07/15/2020   Procedure: LEFT HEART CATH AND CORONARY ANGIOGRAPHY;  Surgeon: Yvonne Kendall, MD;  Location: [facility]  INVASIVE CV LAB;  Service: Cardiovascular;  Laterality: N/A;    Prior to Admission medications   Medication Sig Start Date End Date Taking? Authorizing Provider  ondansetron (ZOFRAN-ODT) 4 MG disintegrating tablet Take 1 tablet (4 mg total) by mouth every 8 (eight) hours as needed. 02/19/21  Yes Korrin Waterfield, Roselyn Bering, PA-C  aspirin 81 MG chewable tablet CHEW ONE TABET BY MOUTH EVERY DAY 07/17/20 07/17/21  Kathlen Mody, MD  atorvastatin (LIPITOR) 80 MG tablet TAKE ONE TABLET BY MOUTH EVERY DAY AT 6:00PM 07/17/20 07/17/21  Kathlen Mody, MD  lisinopril (ZESTRIL) 2.5 MG tablet Take 1 tablet (2.5 mg total) by mouth daily. 07/17/20   Kathlen Mody, MD  lisinopril (ZESTRIL) 5 MG tablet TAKE 1/2 TABLET (2.5MG ) BY MOUTH EVERY DAY 07/17/20 07/17/21  Kathlen Mody, MD  metoprolol tartrate (LOPRESSOR) 25 MG tablet TAKE ONE TABLET BY MOUTH 2 TIMES DAILY 07/17/20 10/18/20  Kathlen Mody, MD  nicotine (NICODERM CQ - DOSED IN MG/24 HOURS) 21 mg/24hr patch Place 1 patch (21 mg total) onto the skin daily. 07/17/20   Kathlen Mody, MD  nitroGLYCERIN (NITROSTAT) 0.4 MG SL tablet 1 TABLET UNDER TONGUE AS NEEDED FOR CHEST PAIN EVERY 5 MINUTES FOR MAX OF 3 DOSES IN 15 MINUTES;IF NO RELIEF AFTER 1ST DOSE CALL 911 07/17/20 07/17/21  Kathlen Mody, MD  prasugrel (EFFIENT) 10 MG TABS tablet TAKE ONE TABLET BY MOUTH EVERY DAY 07/17/20 07/17/21  Kathlen Mody, MD    Allergies Penicillins  No family history on file.  Social History Social History   Tobacco Use   Smoking status: Some Days    Packs/day: 1.50    Years: 30.00  Pack years: 45.00    Types: Cigarettes   Smokeless tobacco: Never  Substance Use Topics   Alcohol use: No   Drug use: Yes    Types: Marijuana    Comment: Yesterday    Review of Systems  Constitutional: Positive fever/chills Eyes: No visual changes. ENT: No sore throat. Respiratory: Denies cough Cardiovascular: Denies chest pain Gastrointestinal: Denies abdominal pain, positive vomiting or  diarrhea Genitourinary: Negative for dysuria. Musculoskeletal: Negative for back pain. Skin: Negative for rash. Psychiatric: no mood changes,     ____________________________________________   PHYSICAL EXAM:  VITAL SIGNS: ED Triage Vitals  Enc Vitals Group     BP 02/19/21 1053 (!) 150/93     Pulse Rate 02/19/21 1053 63     Resp 02/19/21 1053 20     Temp 02/19/21 1053 98.5 F (36.9 C)     Temp Source 02/19/21 1053 Oral     SpO2 02/19/21 1053 97 %     Weight 02/19/21 1052 228 lb (103.4 kg)     Height 02/19/21 1052 6\' 4"  (1.93 m)     Head Circumference --      Peak Flow --      Pain Score 02/19/21 1051 0     Pain Loc --      Pain Edu? --      Excl. in GC? --     Constitutional: Alert and oriented. Well appearing and in no acute distress. Eyes: Conjunctivae are normal.  Head: Atraumatic. Nose: No congestion/rhinnorhea. Mouth/Throat: Mucous membranes are moist.   Neck:  supple no lymphadenopathy noted Cardiovascular: Normal rate, regular rhythm. Heart sounds are normal Respiratory: Normal respiratory effort.  No retractions, lungs c t a  Abd: soft nontender bs normal all 4 quad GU: deferred Musculoskeletal: FROM all extremities, warm and well perfused Neurologic:  Normal speech and language.  Skin:  Skin is warm, dry and intact. No rash noted. Psychiatric: Mood and affect are normal. Speech and behavior are normal.  ____________________________________________   LABS (all labs ordered are listed, but only abnormal results are displayed)  Labs Reviewed  BASIC METABOLIC PANEL - Abnormal; Notable for the following components:      Result Value   Glucose, Bld 161 (*)    All other components within normal limits  CBC - Abnormal; Notable for the following components:   MCHC 36.2 (*)    All other components within normal limits  RESP PANEL BY RT-PCR (FLU A&B, COVID) ARPGX2  PROTIME-INR  HEPATIC FUNCTION PANEL  LIPASE, BLOOD  TROPONIN I (HIGH SENSITIVITY)   TROPONIN I (HIGH SENSITIVITY)   ____________________________________________   ____________________________________________  RADIOLOGY    ____________________________________________   PROCEDURES  Procedure(s) performed: No  Procedures    ____________________________________________   INITIAL IMPRESSION / ASSESSMENT AND PLAN / ED COURSE  Pertinent labs & imaging results that were available during my care of the patient were reviewed by me and considered in my medical decision making (see chart for details).   Patient is a 44 year old male presents emergency department with vomiting and diarrhea.  See HPI.  Physical exam shows patient stable at this time  DDx: Acute gastroenteritis, covid, pancreatitis, diverticulitis, viral illness, dehydration  Labs are reassuring, CBC, metabolic panel and troponin are normal, lipase and hepatic panel are pending.  Patient's INR is 1.0 Respiratory panel is negative for COVID or influenza   Lipase and hepatic panel are normal  I did discuss all the findings with the patient.  He states he is feeling better  after the fluids.  States the nausea medicine helped greatly.  He was given Reglan and Zofran.  Had a better response to Zofran.  Sent a prescription for Zofran ODT to his pharmacy.  He is taking over-the-counter Imodium A-D for diarrhea.  Dietary recommendations for diarrhea sent with the patient.  He is to follow-up with his regular doctor if not improving in 3 days, return emergency department worsening.  Patient agrees with treatment plan and was discharged in stable condition.    Timothy Myers was evaluated in Emergency Department on 02/19/2021 for the symptoms described in the history of present illness. He was evaluated in the context of the global COVID-19 pandemic, which necessitated consideration that the patient might be at risk for infection with the SARS-CoV-2 virus that causes COVID-19. Institutional protocols and  algorithms that pertain to the evaluation of patients at risk for COVID-19 are in a state of rapid change based on information released by regulatory bodies including the CDC and federal and state organizations. These policies and algorithms were followed during the patient's care in the ED.    As part of my medical decision making, I reviewed the following data within the electronic MEDICAL RECORD NUMBER Nursing notes reviewed and incorporated, Labs reviewed , Old chart reviewed, Notes from prior ED visits, and [place] Controlled Substance Database  ____________________________________________   FINAL CLINICAL IMPRESSION(S) / ED DIAGNOSES  Final diagnoses:  Acute gastroenteritis      NEW MEDICATIONS STARTED DURING THIS VISIT:  New Prescriptions   ONDANSETRON (ZOFRAN-ODT) 4 MG DISINTEGRATING TABLET    Take 1 tablet (4 mg total) by mouth every 8 (eight) hours as needed.     Note:  This document was prepared using Dragon voice recognition software and may include unintentional dictation errors.    Faythe Ghee, PA-C 02/19/21 1350    Minna Antis, MD 02/19/21 [phone]

## 2021-02-19 NOTE — ED Triage Notes (Signed)
Pt to ED for emesis since Monday, tightness in chest that started last night. Intermittent shob.  Hx MI Alert and oriented. NAD noted

## 2021-02-19 NOTE — Discharge Instructions (Addendum)
Take the Zofran for nausea/vomiting Try to drink plenty of fluids.  Water/Gatorade/Powerade/ginger ale would be appropriate Take Tylenol or ibuprofen for fever as needed Take Imodium ED for diarrhea Return to the emergency department if worsening

## 2021-02-20 ENCOUNTER — Emergency Department
Admission: EM | Admit: 2021-02-20 | Discharge: 2021-02-21 | Disposition: A | Discharge status: Home / Self Care | Payer: Self-pay | Attending: Emergency Medicine | Admitting: Emergency Medicine

## 2021-02-20 ENCOUNTER — Other Ambulatory Visit: Payer: Self-pay

## 2021-02-20 DIAGNOSIS — R197 Diarrhea, unspecified: Secondary | ICD-10-CM | POA: Insufficient documentation

## 2021-02-20 DIAGNOSIS — I2510 Atherosclerotic heart disease of native coronary artery without angina pectoris: Secondary | ICD-10-CM | POA: Insufficient documentation

## 2021-02-20 DIAGNOSIS — I119 Hypertensive heart disease without heart failure: Secondary | ICD-10-CM | POA: Insufficient documentation

## 2021-02-20 DIAGNOSIS — F17210 Nicotine dependence, cigarettes, uncomplicated: Secondary | ICD-10-CM | POA: Insufficient documentation

## 2021-02-20 DIAGNOSIS — R112 Nausea with vomiting, unspecified: Secondary | ICD-10-CM

## 2021-02-20 DIAGNOSIS — Z79899 Other long term (current) drug therapy: Secondary | ICD-10-CM | POA: Insufficient documentation

## 2021-02-20 DIAGNOSIS — R519 Headache, unspecified: Secondary | ICD-10-CM | POA: Insufficient documentation

## 2021-02-20 DIAGNOSIS — E860 Dehydration: Secondary | ICD-10-CM

## 2021-02-20 DIAGNOSIS — Z7982 Long term (current) use of aspirin: Secondary | ICD-10-CM | POA: Insufficient documentation

## 2021-02-20 LAB — BASIC METABOLIC PANEL
Anion gap: 12 (ref 5–15)
BUN: 14 mg/dL (ref 6–20)
CO2: 22 mmol/L (ref 22–32)
Calcium: 9.5 mg/dL (ref 8.9–10.3)
Chloride: 101 mmol/L (ref 98–111)
Creatinine, Ser: 0.91 mg/dL (ref 0.61–1.24)
GFR, Estimated: >60 mL/min (ref >60)
Glucose, Bld: 112 mg/dL — ABNORMAL HIGH (ref 70–99)
Potassium: 3.3 mmol/L — ABNORMAL LOW (ref 3.5–5.1)
Sodium: 135 mmol/L (ref 135–145)

## 2021-02-20 LAB — CBC
HCT: 43.3 % (ref 39.0–52.0)
Hemoglobin: 15.7 g/dL (ref 13.0–17.0)
MCH: 31.9 pg (ref 26.0–34.0)
MCHC: 36.3 g/dL — ABNORMAL HIGH (ref 30.0–36.0)
MCV: 88 fL (ref 80.0–100.0)
Platelets: 219 10*3/uL (ref 150–400)
RBC: 4.92 MIL/uL (ref 4.22–5.81)
RDW: 12.9 % (ref 11.5–15.5)
WBC: 11.1 10*3/uL — ABNORMAL HIGH (ref 4.0–10.5)
nRBC: 0 % (ref 0.0–0.2)

## 2021-02-20 LAB — TROPONIN I (HIGH SENSITIVITY)
Troponin I (High Sensitivity): 16 ng/L (ref <18)
Troponin I (High Sensitivity): 16 ng/L (ref <18)

## 2021-02-20 MED ORDER — LOPERAMIDE HCL 2 MG PO CAPS
4.0000 mg | ORAL_CAPSULE | Freq: Once | ORAL | Status: AC
  Administered 2021-02-20: 4 mg via ORAL
  Filled 2021-02-20: qty 2

## 2021-02-20 MED ORDER — SODIUM CHLORIDE 0.9 % IV BOLUS
1000.0000 mL | Freq: Once | INTRAVENOUS | Status: AC
  Administered 2021-02-20: 1000 mL via INTRAVENOUS

## 2021-02-20 MED ORDER — ONDANSETRON HCL 4 MG/2ML IJ SOLN
4.0000 mg | Freq: Once | INTRAMUSCULAR | Status: AC | PRN
  Administered 2021-02-20: 4 mg via INTRAVENOUS
  Filled 2021-02-20: qty 2

## 2021-02-20 MED ORDER — PROMETHAZINE HCL 25 MG/ML IJ SOLN
12.5000 mg | Freq: Four times a day (QID) | INTRAMUSCULAR | Status: DC | PRN
Start: 2021-02-20 — End: 2021-02-21
  Administered 2021-02-20: 12.5 mg via INTRAVENOUS
  Filled 2021-02-20: qty 0.5

## 2021-02-20 MED ORDER — SODIUM CHLORIDE 0.9 % IV BOLUS
1000.0000 mL | Freq: Once | INTRAVENOUS | Status: AC
Start: 2021-02-20 — End: 2021-02-21
  Administered 2021-02-20: 1000 mL via INTRAVENOUS

## 2021-02-20 MED ORDER — KETOROLAC TROMETHAMINE 30 MG/ML IJ SOLN
30.0000 mg | Freq: Once | INTRAMUSCULAR | Status: AC
  Administered 2021-02-20: 30 mg via INTRAVENOUS
  Filled 2021-02-20: qty 1

## 2021-02-20 MED ORDER — PROMETHAZINE HCL 25 MG/ML IJ SOLN
12.5000 mg | Freq: Four times a day (QID) | INTRAMUSCULAR | Status: DC | PRN
Start: 2021-02-20 — End: 2021-02-20
  Filled 2021-02-20: qty 0.5

## 2021-02-20 NOTE — ED Provider Notes (Signed)
[facility] Emergency Department Provider Note  Time seen: 9:10 PM  I have reviewed the triage vital signs and the nursing notes.   HISTORY  Chief Complaint Emesis and Diarrhea   HPI Timothy Myers is a 45 y.o. male with a past medical history of hypertension, prior MI, presents to the emergency department for nausea vomiting diarrhea.  According to the patient over the past 3 days he has been experiencing frequent nausea vomiting and diarrhea.  Denies any abdominal pain.  No fever.  No cough or shortness of breath.  Does state intermittent headaches.  Patient was seen yesterday for the same had an overall reassuring work-up yesterday including lab work, COVID.  Patient states he went home and tried taking Zofran and Imodium but has not been able to keep them down so he came back to the emergency department today.    Past Medical History:  Diagnosis Date   Hypertension    MI (myocardial infarction) [facility])     Patient Active Problem List   Diagnosis Date Noted   STEMI (ST elevation myocardial infarction) (HCC) 07/15/2020   Nicotine dependence 07/15/2020   CAD (coronary artery disease) 07/15/2020   ST elevation myocardial infarction involving left circumflex coronary artery (HCC) 01/21/2017    Past Surgical History:  Procedure Laterality Date   CORONARY/GRAFT ACUTE MI REVASCULARIZATION N/A 01/21/2017   Procedure: Coronary/Graft Acute MI Revascularization;  Surgeon: Alwyn Pea, MD;  Location: [facility] INVASIVE CV LAB;  Service: Cardiovascular;  Laterality: N/A;   CORONARY/GRAFT ACUTE MI REVASCULARIZATION N/A 07/15/2020   Procedure: Coronary/Graft Acute MI Revascularization;  Surgeon: Yvonne Kendall, MD;  Location: [facility] INVASIVE CV LAB;  Service: Cardiovascular;  Laterality: N/A;   LEFT HEART CATH AND CORONARY ANGIOGRAPHY N/A 01/21/2017   Procedure: LEFT HEART CATH AND CORONARY ANGIOGRAPHY;  Surgeon: Alwyn Pea, MD;  Location: [facility] INVASIVE CV LAB;   Service: Cardiovascular;  Laterality: N/A;   LEFT HEART CATH AND CORONARY ANGIOGRAPHY N/A 07/15/2020   Procedure: LEFT HEART CATH AND CORONARY ANGIOGRAPHY;  Surgeon: Yvonne Kendall, MD;  Location: [facility] INVASIVE CV LAB;  Service: Cardiovascular;  Laterality: N/A;    Prior to Admission medications   Medication Sig Start Date End Date Taking? Authorizing Provider  aspirin 81 MG chewable tablet CHEW ONE TABET BY MOUTH EVERY DAY 07/17/20 07/17/21  Kathlen Mody, MD  atorvastatin (LIPITOR) 80 MG tablet TAKE ONE TABLET BY MOUTH EVERY DAY AT 6:00PM 07/17/20 07/17/21  Kathlen Mody, MD  lisinopril (ZESTRIL) 2.5 MG tablet Take 1 tablet (2.5 mg total) by mouth daily. 07/17/20   Kathlen Mody, MD  lisinopril (ZESTRIL) 5 MG tablet TAKE 1/2 TABLET (2.5MG ) BY MOUTH EVERY DAY 07/17/20 07/17/21  Kathlen Mody, MD  metoprolol tartrate (LOPRESSOR) 25 MG tablet TAKE ONE TABLET BY MOUTH 2 TIMES DAILY 07/17/20 10/18/20  Kathlen Mody, MD  nicotine (NICODERM CQ - DOSED IN MG/24 HOURS) 21 mg/24hr patch Place 1 patch (21 mg total) onto the skin daily. 07/17/20   Kathlen Mody, MD  nitroGLYCERIN (NITROSTAT) 0.4 MG SL tablet 1 TABLET UNDER TONGUE AS NEEDED FOR CHEST PAIN EVERY 5 MINUTES FOR MAX OF 3 DOSES IN 15 MINUTES;IF NO RELIEF AFTER 1ST DOSE CALL 911 07/17/20 07/17/21  Kathlen Mody, MD  ondansetron (ZOFRAN-ODT) 4 MG disintegrating tablet Take 1 tablet (4 mg total) by mouth every 8 (eight) hours as needed. 02/19/21   Faythe Ghee, PA-C  prasugrel (EFFIENT) 10 MG TABS tablet TAKE ONE TABLET BY MOUTH EVERY DAY 07/17/20 07/17/21  Kathlen Mody,  MD    Allergies  Allergen Reactions   Penicillins Anaphylaxis    History reviewed. No pertinent family history.  Social History Social History   Tobacco Use   Smoking status: Some Days    Packs/day: 1.50    Years: 30.00    Pack years: 45.00    Types: Cigarettes   Smokeless tobacco: Never  Substance Use Topics   Alcohol use: No   Drug use: Yes    Types: Marijuana    Comment:  Yesterday    Review of Systems Constitutional: Negative for fever. Cardiovascular: Negative for chest pain. Respiratory: Negative for shortness of breath. Gastrointestinal: Negative for abdominal pain.  Positive for nausea vomiting and diarrhea. Genitourinary: States decreased urine. Musculoskeletal: Negative for musculoskeletal complaints Neurological: Negative for headache All other ROS negative  ____________________________________________   PHYSICAL EXAM:  VITAL SIGNS: ED Triage Vitals  Enc Vitals Group     BP 02/20/21 1850 (!) 144/90     Pulse Rate 02/20/21 1845 73     Resp 02/20/21 1845 20     Temp 02/20/21 1845 98.4 F (36.9 C)     Temp Source 02/20/21 1845 Oral     SpO2 02/20/21 2029 95 %     Weight 02/20/21 1847 220 lb (99.8 kg)     Height 02/20/21 1847 6\' 4"  (1.93 m)     Head Circumference --      Peak Flow --      Pain Score 02/20/21 1846 3     Pain Loc --      Pain Edu? --      Excl. in GC? --    Constitutional: Alert and oriented.  No acute distress. Eyes: Normal exam ENT      Head: Normocephalic and atraumatic.      Mouth/Throat: Mucous membranes are moist. Cardiovascular: Normal rate, regular rhythm.  Respiratory: Normal respiratory effort without tachypnea nor retractions. Breath sounds are clear and equal bilaterally. No wheezes/rales/rhonchi. Gastrointestinal: Soft and nontender. No distention.  Benign abdominal exam. Musculoskeletal: Nontender with normal range of motion in all extremities.  Neurologic:  Normal speech and language. No gross focal neurologic deficits  Skin:  Skin is warm, dry and intact.  Psychiatric: Mood and affect are normal.   ____________________________________________   INITIAL IMPRESSION / ASSESSMENT AND PLAN / ED COURSE  Pertinent labs & imaging results that were available during my care of the patient were reviewed by me and considered in my medical decision making (see chart for details).   Patient presents  emergency department for continued nausea vomiting and diarrhea.  Patient has a benign abdominal exam denies any abdominal pain at any point.  No fever.  Reviewed the patient's work-up from yesterday is overall reassuring.  COVID/flu negative.  We will IV hydrate, treat with Phenergan, Toradol and Imodium.  We will continue to closely monitor.  I would like to check a urinalysis today as well.  Anion gap of 12 currently likely indicating mild dehydration.  We will IV hydrate with 2 L of normal saline.  Urine pending.  Patient care signed out to oncoming provider.  ALIAS VILLAGRAN was evaluated in Emergency Department on 02/20/2021 for the symptoms described in the history of present illness. He was evaluated in the context of the global COVID-19 pandemic, which necessitated consideration that the patient might be at risk for infection with the SARS-CoV-2 virus that causes COVID-19. Institutional protocols and algorithms that pertain to the evaluation of patients at risk for COVID-19 are in  a state of rapid change based on information released by regulatory bodies including the CDC and federal and state organizations. These policies and algorithms were followed during the patient's care in the ED.  ____________________________________________   FINAL CLINICAL IMPRESSION(S) / ED DIAGNOSES  Dehydration Nausea vomiting diarrhea   Minna Antis, MD 02/20/21 2319

## 2021-02-20 NOTE — ED Triage Notes (Signed)
Pt presents to ED with c/o of being seen yesterday for emesis and diarrhea and states today it has got worse and keep Zofran down.   Pt also has c/o of  dizziness as well.   Pt denies fevers or chills. NAD noted at this time

## 2021-02-21 LAB — URINALYSIS, COMPLETE (UACMP) WITH MICROSCOPIC
Bacteria, UA: NONE SEEN
Glucose, UA: NEGATIVE mg/dL
Hgb urine dipstick: NEGATIVE
Leukocytes,Ua: NEGATIVE
Nitrite: NEGATIVE
Specific Gravity, Urine: 1.025 (ref 1.005–1.030)
pH: 6 (ref 5.0–8.0)

## 2021-02-21 NOTE — ED Provider Notes (Signed)
The patient is resting comfortably without distress.  He is now trying by mouth to evaluate for improvement in vomiting.  Resting comfortably without distress.  Urinalysis reviewed and shows slight ketones some haziness but no evidence of infection.  ----------------------------------------- 2:19 AM on 02/21/2021 ----------------------------------------- Patient has been able to drink 2 cups of liquid eat crackers and feels much better.  Reports that he does feel comfortable going home has prescription for Zofran dissolvable at home, and before leaving he reports he would like 1 extra dose of Zofran IV just to assure he does not feel to nauseated.  He looks well reports feeling improved, appears stable and appropriate for discharge  Return precautions and treatment recommendations and follow-up discussed with the patient who is agreeable with the plan.    Sharyn Creamer, MD 02/21/21 [phone]

## 2021-03-04 ENCOUNTER — Other Ambulatory Visit
Admission: RE | Admit: 2021-03-04 | Discharge: 2021-03-04 | Disposition: A | Discharge status: Home / Self Care | Payer: Self-pay | Source: Ambulatory Visit | Attending: Student | Admitting: Student

## 2021-03-04 DIAGNOSIS — I25118 Atherosclerotic heart disease of native coronary artery with other forms of angina pectoris: Secondary | ICD-10-CM | POA: Insufficient documentation

## 2021-03-04 DIAGNOSIS — R0602 Shortness of breath: Secondary | ICD-10-CM | POA: Insufficient documentation

## 2021-03-04 LAB — BRAIN NATRIURETIC PEPTIDE: B Natriuretic Peptide: 11 pg/mL (ref 0.0–100.0)

## 2021-11-18 ENCOUNTER — Other Ambulatory Visit
Admission: RE | Admit: 2021-11-18 | Discharge: 2021-11-18 | Disposition: A | Discharge status: Home / Self Care | Payer: Self-pay | Source: Ambulatory Visit | Attending: Student | Admitting: Student

## 2021-11-18 DIAGNOSIS — R0602 Shortness of breath: Secondary | ICD-10-CM | POA: Insufficient documentation

## 2021-11-18 DIAGNOSIS — R000 Tachycardia, unspecified: Secondary | ICD-10-CM | POA: Insufficient documentation

## 2021-11-18 LAB — BRAIN NATRIURETIC PEPTIDE: B Natriuretic Peptide: 30.7 pg/mL (ref 0.0–100.0)

## 2022-01-21 IMAGING — DX DG ANKLE COMPLETE 3+V*R*
4 series · 4 of 4 positions shown · non-contrast
Comparison: None.

CLINICAL DATA: Pain and swelling for 2 days

EXAM:
RIGHT ANKLE - COMPLETE 3+ VIEW

[ankle obl (1 of 2)]
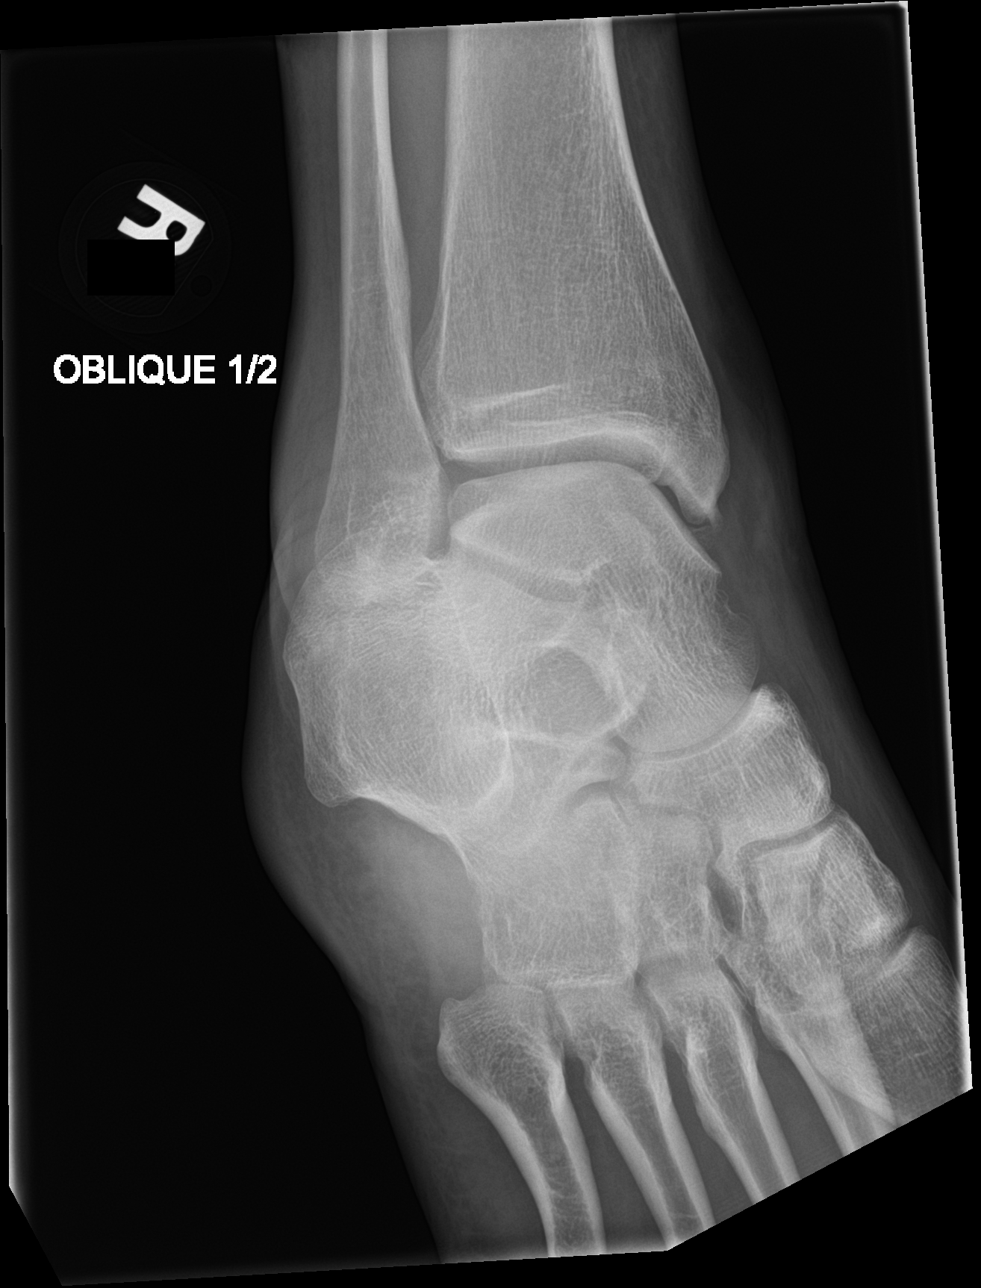

[ankle lat]
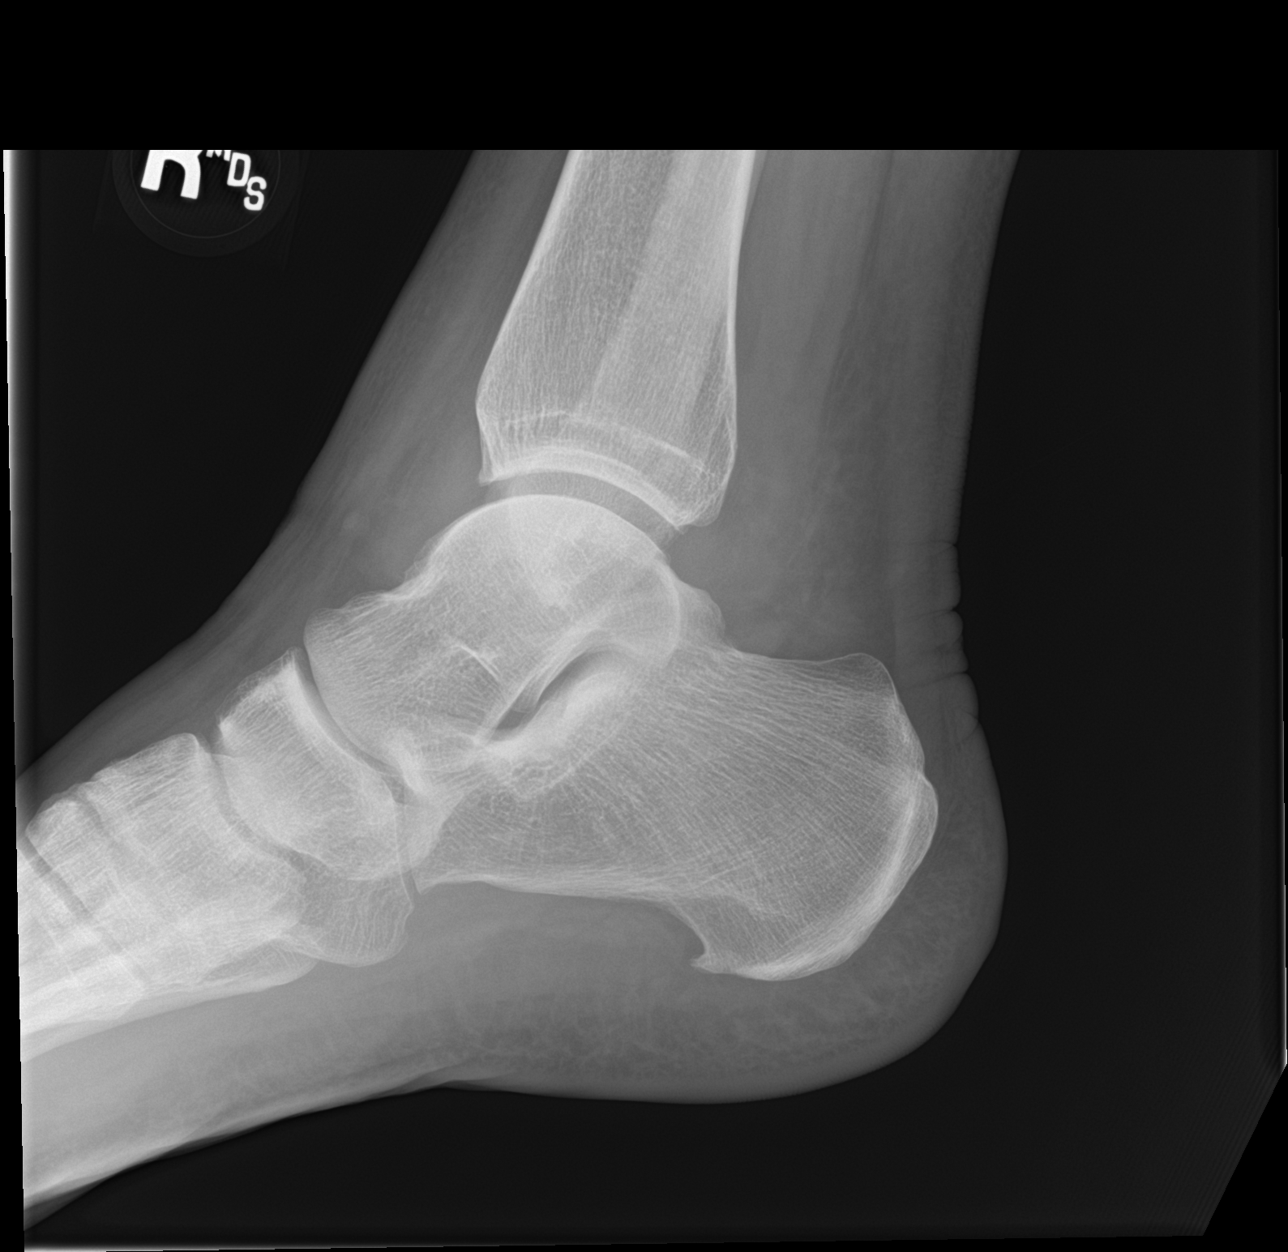

[ankle ap]
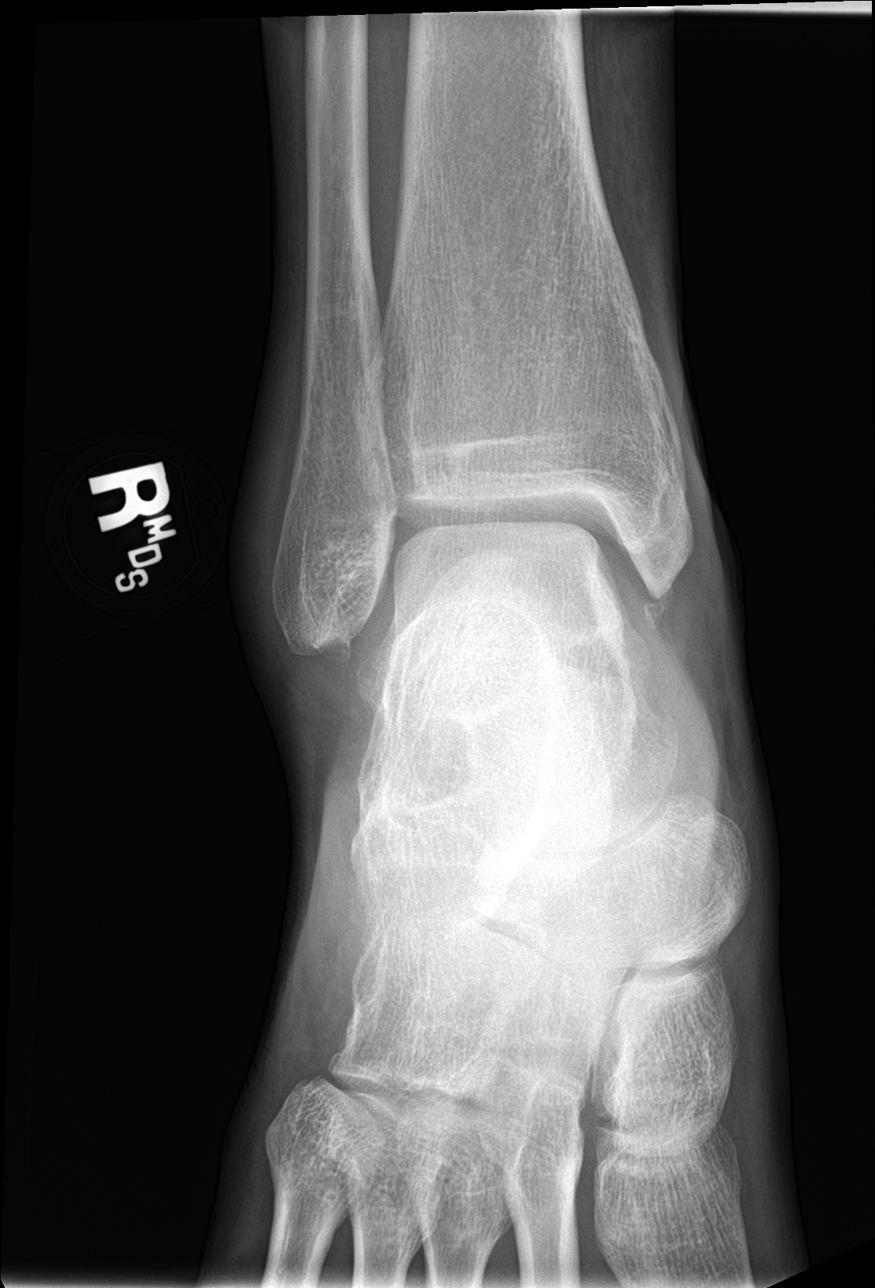

[ankle obl (2 of 2)]
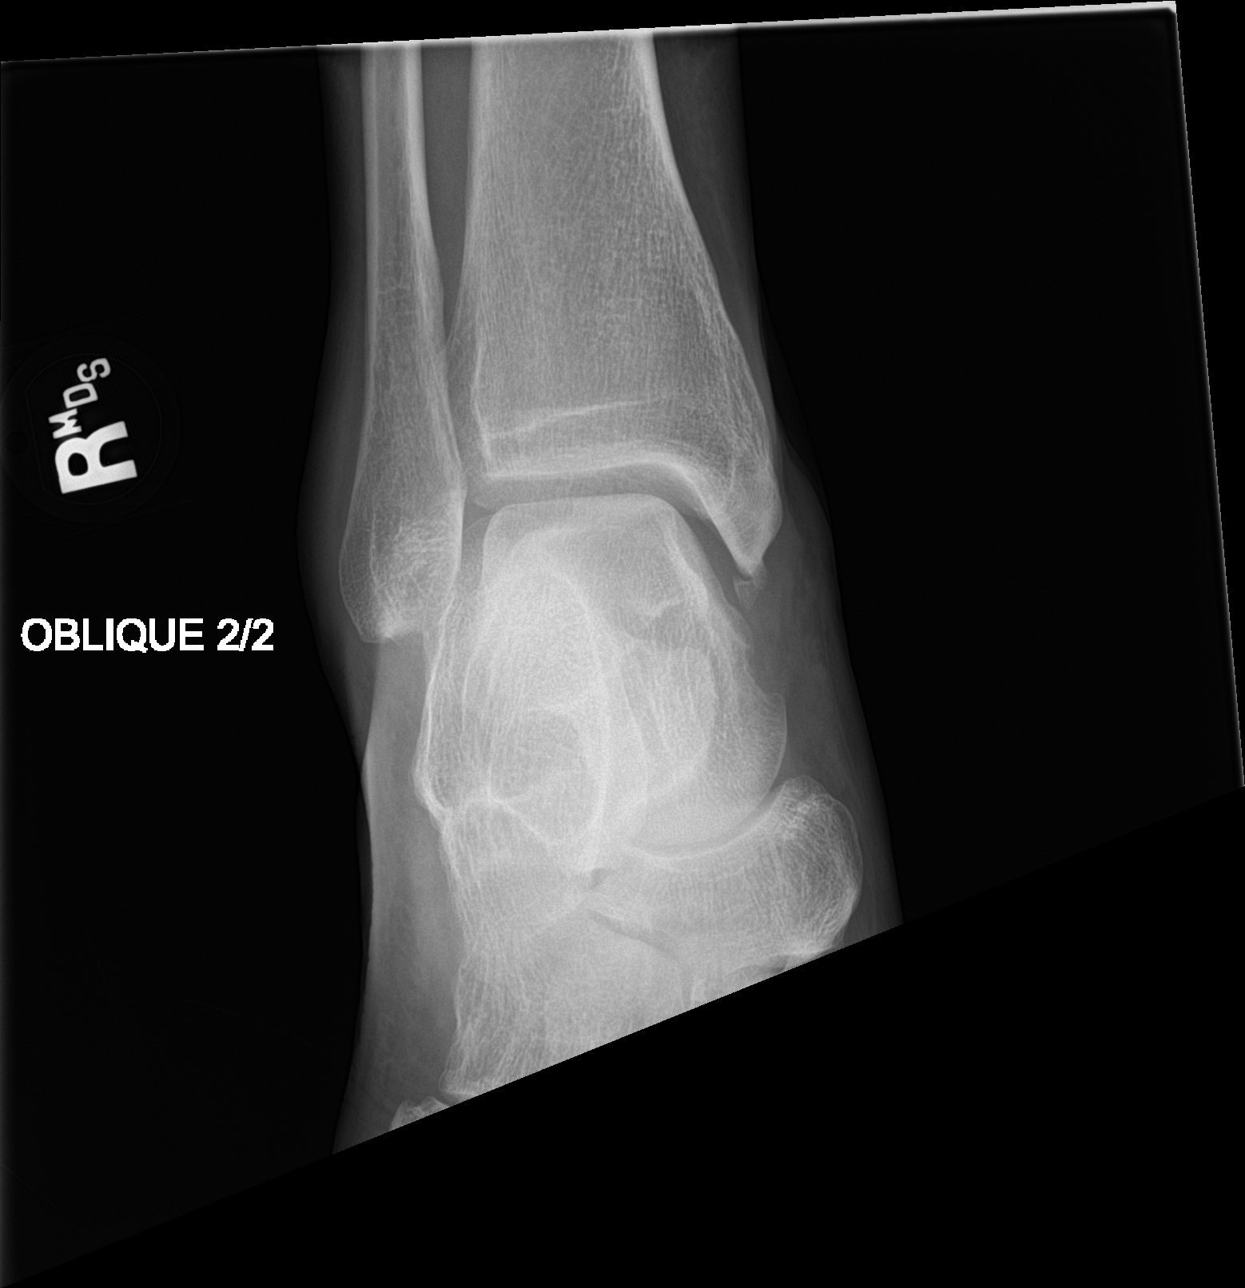

[4 of 4 positions shown; findings below may reference images not displayed]

FINDINGS: There is no evidence of fracture, dislocation, or joint effusion.
There is no evidence of arthropathy or other focal bone abnormality.
Soft tissue swelling seen around the lateral malleolus. Small
ossicles are seen adjacent to the medial malleolus. Calcaneal
enthesophyte seen on the plantar surface.
IMPRESSION: Negative.

## 2022-02-24 ENCOUNTER — Emergency Department: Payer: Self-pay

## 2022-02-24 ENCOUNTER — Emergency Department
Admission: EM | Admit: 2022-02-24 | Discharge: 2022-02-25 | Disposition: A | Discharge status: Home / Self Care | Payer: Self-pay | Attending: Emergency Medicine | Admitting: Emergency Medicine

## 2022-02-24 ENCOUNTER — Other Ambulatory Visit: Payer: Self-pay

## 2022-02-24 DIAGNOSIS — R079 Chest pain, unspecified: Secondary | ICD-10-CM

## 2022-02-24 DIAGNOSIS — I10 Essential (primary) hypertension: Secondary | ICD-10-CM | POA: Insufficient documentation

## 2022-02-24 DIAGNOSIS — Z79899 Other long term (current) drug therapy: Secondary | ICD-10-CM | POA: Insufficient documentation

## 2022-02-24 DIAGNOSIS — J441 Chronic obstructive pulmonary disease with (acute) exacerbation: Secondary | ICD-10-CM | POA: Insufficient documentation

## 2022-02-24 DIAGNOSIS — I2510 Atherosclerotic heart disease of native coronary artery without angina pectoris: Secondary | ICD-10-CM | POA: Insufficient documentation

## 2022-02-24 LAB — CBC WITH DIFFERENTIAL/PLATELET
Abs Immature Granulocytes: 0.01 10*3/uL (ref 0.00–0.07)
Basophils Absolute: 0 10*3/uL (ref 0.0–0.1)
Basophils Relative: 1 %
Eosinophils Absolute: 0.2 10*3/uL (ref 0.0–0.5)
Eosinophils Relative: 5 %
HCT: 43.2 % (ref 39.0–52.0)
Hemoglobin: 14.3 g/dL (ref 13.0–17.0)
Immature Granulocytes: 0 %
Lymphocytes Relative: 36 %
Lymphs Abs: 1.5 10*3/uL (ref 0.7–4.0)
MCH: 30 pg (ref 26.0–34.0)
MCHC: 33.1 g/dL (ref 30.0–36.0)
MCV: 90.6 fL (ref 80.0–100.0)
Monocytes Absolute: 0.6 10*3/uL (ref 0.1–1.0)
Monocytes Relative: 15 %
Neutro Abs: 1.8 10*3/uL (ref 1.7–7.7)
Neutrophils Relative %: 43 %
Platelets: 144 10*3/uL — ABNORMAL LOW (ref 150–400)
RBC: 4.77 MIL/uL (ref 4.22–5.81)
RDW: 12.8 % (ref 11.5–15.5)
WBC: 4.2 10*3/uL (ref 4.0–10.5)
nRBC: 0 % (ref 0.0–0.2)

## 2022-02-24 LAB — COMPREHENSIVE METABOLIC PANEL
ALT: 45 U/L — ABNORMAL HIGH (ref 0–44)
AST: 45 U/L — ABNORMAL HIGH (ref 15–41)
Albumin: 5.2 g/dL — ABNORMAL HIGH (ref 3.5–5.0)
Alkaline Phosphatase: 128 U/L — ABNORMAL HIGH (ref 38–126)
Anion gap: 9 (ref 5–15)
BUN: 24 mg/dL — ABNORMAL HIGH (ref 6–20)
CO2: 23 mmol/L (ref 22–32)
Calcium: 8.8 mg/dL — ABNORMAL LOW (ref 8.9–10.3)
Chloride: 104 mmol/L (ref 98–111)
Creatinine, Ser: 0.95 mg/dL (ref 0.61–1.24)
GFR, Estimated: >60 mL/min (ref >60)
Glucose, Bld: 119 mg/dL — ABNORMAL HIGH (ref 70–99)
Potassium: 3.1 mmol/L — ABNORMAL LOW (ref 3.5–5.1)
Sodium: 136 mmol/L (ref 135–145)
Total Bilirubin: 0.5 mg/dL (ref 0.3–1.2)
Total Protein: 8.4 g/dL — ABNORMAL HIGH (ref 6.5–8.1)

## 2022-02-24 LAB — TROPONIN I (HIGH SENSITIVITY): Troponin I (High Sensitivity): 14 ng/L (ref <18)

## 2022-02-24 LAB — LIPASE, BLOOD: Lipase: 27 U/L (ref 11–51)

## 2022-02-24 MED ORDER — IPRATROPIUM BROMIDE 0.02 % IN SOLN
0.5000 mg | Freq: Once | RESPIRATORY_TRACT | Status: AC
  Administered 2022-02-25: 0.5 mg via RESPIRATORY_TRACT
  Filled 2022-02-24: qty 2.5

## 2022-02-24 MED ORDER — PREDNISONE 20 MG PO TABS
60.0000 mg | ORAL_TABLET | Freq: Once | ORAL | Status: AC
  Administered 2022-02-25: 60 mg via ORAL
  Filled 2022-02-24: qty 3

## 2022-02-24 MED ORDER — ALBUTEROL SULFATE (2.5 MG/3ML) 0.083% IN NEBU
5.0000 mg | INHALATION_SOLUTION | Freq: Once | RESPIRATORY_TRACT | Status: AC
  Administered 2022-02-25: 5 mg via RESPIRATORY_TRACT
  Filled 2022-02-24: qty 6

## 2022-02-24 NOTE — ED Provider Notes (Signed)
[facility] Provider Note    Event Date/Time   First MD Initiated Contact with Patient 02/24/22 2259     (approximate)   History   Chest Pain   HPI  RAINEN VANROSSUM is a 46 y.o. male with history of CAD, hypertension, COPD who presents to the emergency department with complaints of chest tightness for the past 2 to 3 days.  Has had intermittent shortness of breath.  No fevers, cough.  No history of PE or DVT.  Also reports some upper abdominal discomfort and feels like "something is going on there".  Has had some nausea without vomiting.  No diarrhea.  Denies alcohol use.  No history of pancreatitis.  States he still has his gallbladder.   History provided by patient.    Past Medical History:  Diagnosis Date   Hypertension    MI (myocardial infarction) [facility])     Past Surgical History:  Procedure Laterality Date   CORONARY/GRAFT ACUTE MI REVASCULARIZATION N/A 01/21/2017   Procedure: Coronary/Graft Acute MI Revascularization;  Surgeon: Alwyn Pea, MD;  Location: [facility] INVASIVE CV LAB;  Service: Cardiovascular;  Laterality: N/A;   CORONARY/GRAFT ACUTE MI REVASCULARIZATION N/A 07/15/2020   Procedure: Coronary/Graft Acute MI Revascularization;  Surgeon: Yvonne Kendall, MD;  Location: [facility] INVASIVE CV LAB;  Service: Cardiovascular;  Laterality: N/A;   LEFT HEART CATH AND CORONARY ANGIOGRAPHY N/A 01/21/2017   Procedure: LEFT HEART CATH AND CORONARY ANGIOGRAPHY;  Surgeon: Alwyn Pea, MD;  Location: [facility] INVASIVE CV LAB;  Service: Cardiovascular;  Laterality: N/A;   LEFT HEART CATH AND CORONARY ANGIOGRAPHY N/A 07/15/2020   Procedure: LEFT HEART CATH AND CORONARY ANGIOGRAPHY;  Surgeon: Yvonne Kendall, MD;  Location: [facility] INVASIVE CV LAB;  Service: Cardiovascular;  Laterality: N/A;    MEDICATIONS:  Prior to Admission medications   Medication Sig Start Date End Date Taking? Authorizing Provider  atorvastatin (LIPITOR) 80 MG tablet TAKE ONE  TABLET BY MOUTH EVERY DAY AT 6:00PM 07/17/20 07/17/21  Kathlen Mody, MD  lisinopril (ZESTRIL) 2.5 MG tablet Take 1 tablet (2.5 mg total) by mouth daily. 07/17/20   Kathlen Mody, MD  lisinopril (ZESTRIL) 5 MG tablet TAKE 1/2 TABLET (2.5MG ) BY MOUTH EVERY DAY 07/17/20 07/17/21  Kathlen Mody, MD  metoprolol tartrate (LOPRESSOR) 25 MG tablet TAKE ONE TABLET BY MOUTH 2 TIMES DAILY 07/17/20 10/18/20  Kathlen Mody, MD  nicotine (NICODERM CQ - DOSED IN MG/24 HOURS) 21 mg/24hr patch Place 1 patch (21 mg total) onto the skin daily. 07/17/20   Kathlen Mody, MD  nitroGLYCERIN (NITROSTAT) 0.4 MG SL tablet 1 TABLET UNDER TONGUE AS NEEDED FOR CHEST PAIN EVERY 5 MINUTES FOR MAX OF 3 DOSES IN 15 MINUTES;IF NO RELIEF AFTER 1ST DOSE CALL 911 07/17/20 07/17/21  Kathlen Mody, MD  ondansetron (ZOFRAN-ODT) 4 MG disintegrating tablet Take 1 tablet (4 mg total) by mouth every 8 (eight) hours as needed. 02/19/21   Faythe Ghee, PA-C    Physical Exam   Triage Vital Signs: ED Triage Vitals  Enc Vitals Group     BP 02/24/22 1947 (!) 151/96     Pulse Rate 02/24/22 1947 75     Resp 02/24/22 1947 17     Temp 02/24/22 1948 98.4 F (36.9 C)     Temp Source 02/24/22 1948 Oral     SpO2 02/24/22 1947 98 %     Weight 02/24/22 1947 230 lb (104.3 kg)     Height 02/24/22 1947 6\' 4"  (1.93 m)  Head Circumference --      Peak Flow --      Pain Score 02/24/22 1946 5     Pain Loc --      Pain Edu? --      Excl. in GC? --     Most recent vital signs: Vitals:   02/24/22 2330 02/25/22 0000  BP:  122/88  Pulse: 60 83  Resp: 18 20  Temp:    SpO2: 94% 99%    CONSTITUTIONAL: Alert and oriented and responds appropriately to questions. Well-appearing; well-nourished HEAD: Normocephalic, atraumatic EYES: Conjunctivae clear, pupils appear equal, sclera nonicteric ENT: normal nose; moist mucous membranes NECK: Supple, normal ROM CARD: RRR; S1 and S2 appreciated; no murmurs, no clicks, no rubs, no gallops RESP: Normal chest  excursion without splinting or tachypnea; breath equal bilaterally.  Scattered expiratory wheezes.  No rhonchi or rales.  No hypoxia or respiratory distress, speaking full sentences ABD/GI: Normal bowel sounds; non-distended; soft, non-tender, no rebound, no guarding, no peritoneal signs; negative Murphy sign BACK: The back appears normal EXT: Normal ROM in all joints; no deformity noted, no edema; no cyanosis SKIN: Normal color for age and race; warm; no rash on exposed skin NEURO: Moves all extremities equally, normal speech PSYCH: The patient's mood and manner are appropriate.   ED Results / Procedures / Treatments   LABS: (all labs ordered are listed, but only abnormal results are displayed) Labs Reviewed  CBC WITH DIFFERENTIAL/PLATELET - Abnormal; Notable for the following components:      Result Value   Platelets 144 (*)    All other components within normal limits  COMPREHENSIVE METABOLIC PANEL - Abnormal; Notable for the following components:   Potassium 3.1 (*)    Glucose, Bld 119 (*)    BUN 24 (*)    Calcium 8.8 (*)    Total Protein 8.4 (*)    Albumin 5.2 (*)    AST 45 (*)    ALT 45 (*)    Alkaline Phosphatase 128 (*)    All other components within normal limits  LIPASE, BLOOD  TROPONIN I (HIGH SENSITIVITY)  TROPONIN I (HIGH SENSITIVITY)     EKG:  EKG Interpretation  Date/Time:  Wednesday February 24 2022 19:50:09 EDT Ventricular Rate:  65 PR Interval:  158 QRS Duration: 112 QT Interval:  402 QTC Calculation: 418 R Axis:   68 Text Interpretation: Normal sinus rhythm Normal ECG No significant change since last tracing Confirmed by Rochele Raring [phone]) on 02/24/2022 11:09:01 PM         RADIOLOGY: My personal review and interpretation of imaging: Ultrasound shows no acute abnormality.  Chest x-ray clear.  I have personally reviewed all radiology reports.   US ABDOMEN LIMITED RUQ (LIVER/GB)  Result Date: 02/25/2022 CLINICAL DATA:  Elevated LFTs. EXAM:  ULTRASOUND ABDOMEN LIMITED RIGHT UPPER QUADRANT COMPARISON:  None Available. FINDINGS: Gallbladder: No gallstones or wall thickening visualized. No sonographic Murphy sign noted by sonographer. Common bile duct: Diameter: 4 mm Liver: No focal lesion identified. Within normal limits in parenchymal echogenicity. Portal vein is patent on color Doppler imaging with normal direction of blood flow towards the liver. Other: None. IMPRESSION: Unremarkable right upper quadrant ultrasound. Electronically Signed   By: Elgie Collard M.D.   On: 02/25/2022 00:08   DG Chest 2 View  Result Date: 02/24/2022 CLINICAL DATA:  CP EXAM: CHEST - 2 VIEW COMPARISON:  Chest x-ray 02/19/2021 FINDINGS: The heart and mediastinal contours are unchanged. No focal consolidation. No pulmonary edema. No  pleural effusion. No pneumothorax. No acute osseous abnormality. IMPRESSION: No active cardiopulmonary disease. Electronically Signed   By: Tish Frederickson M.D.   On: 02/24/2022 20:11     PROCEDURES:  Critical Care performed: No     .1-3 Lead EKG Interpretation  Performed by: Shanara Schnieders, Layla Maw, DO Authorized by: Torian Thoennes, Layla Maw, DO     Interpretation: normal     ECG rate:  66   ECG rate assessment: normal     Rhythm: sinus rhythm     Ectopy: none     Conduction: normal       IMPRESSION / MDM / ASSESSMENT AND PLAN / ED COURSE  I reviewed the triage vital signs and the nursing notes.    Patient here with complaints of chest pain, shortness of breath and upper abdominal discomfort.  The patient is on the cardiac monitor to evaluate for evidence of arrhythmia and/or significant heart rate changes.   DIFFERENTIAL DIAGNOSIS (includes but not limited to):   ACS, COPD, biliary colic, pancreatitis, gastritis, less likely PE, dissection, CHF, pneumothorax, pneumonia   Patient's presentation is most consistent with acute presentation with potential threat to life or bodily function.   PLAN: We will obtain CBC, CMP,  lipase, troponin x2, chest x-ray, right upper quadrant ultrasound.  EKG nonischemic.  Will give albuterol, Atrovent and prednisone and reassess symptoms.   MEDICATIONS GIVEN IN ED: Medications  albuterol (PROVENTIL) (2.5 MG/3ML) 0.083% nebulizer solution 5 mg (5 mg Nebulization Given 02/25/22 0001)  ipratropium (ATROVENT) nebulizer solution 0.5 mg (0.5 mg Nebulization Given 02/25/22 0001)  predniSONE (DELTASONE) tablet 60 mg (60 mg Oral Given 02/25/22 0001)  potassium chloride SA (KLOR-CON M) CR tablet 40 mEq (40 mEq Oral Given 02/25/22 0035)  ondansetron (ZOFRAN-ODT) disintegrating tablet 4 mg (4 mg Oral Given 02/25/22 0034)     ED COURSE: Patient's labs reviewed.  No leukocytosis, normal hemoglobin.  Potassium slightly low at 3.1.  Given oral replacement.  Minimal elevation of his AST, ALT and alkaline phosphatase but normal total bilirubin and lipase.  Troponin x2 negative.  Chest x-ray and ultrasound reviewed and interpreted by myself and the radiologist and shows no acute abnormality.  Patient's lungs are clear to auscultation and he reports the tightness has improved after breathing treatment.  I suspect that his symptoms are from a COPD exacerbation.  While asleep patient does have oxygen saturations that dropped to 89%.  States his pulmonologist is trying to get him an at home sleep study to evaluate for sleep apnea but he states that that test is too expensive for him.  I suspect that he has some sleep apnea.  Immediately upon waking up his sats come up to 97% on room air.  Will discharge with albuterol inhaler, prednisone burst.  Patient is comfortable with this plan.   At this time, I do not feel there is any life-threatening condition present. I reviewed all nursing notes, vitals, pertinent previous records.  All lab and urine results, EKGs, imaging ordered have been independently reviewed and interpreted by myself.  I reviewed all available radiology reports from any imaging ordered this visit.   Based on my assessment, I feel the patient is safe to be discharged home without further emergent workup and can continue workup as an outpatient as needed. Discussed all findings, treatment plan as well as usual and customary return precautions.  They verbalize understanding and are comfortable with this plan.  Outpatient follow-up has been provided as needed.  All questions have been answered.  CONSULTS: Admission considered given patient does have risk factors for ACS however symptoms seem atypical and he has had 2 negative troponins and chest tightness resolved with breathing treatments.   OUTSIDE RECORDS REVIEWED: Reviewed patient's last cardiology note with Dr. Juliann Pares on 01/20/2022 and last pulmonology note with Dr. Meredeth Ide on 02/03/2022.       FINAL CLINICAL IMPRESSION(S) / ED DIAGNOSES   Final diagnoses:  Nonspecific chest pain  COPD exacerbation (HCC)     Rx / DC Orders   ED Discharge Orders          Ordered    albuterol (VENTOLIN HFA) 108 (90 Base) MCG/ACT inhaler  Every 4 hours PRN        02/25/22 0118    predniSONE (DELTASONE) 20 MG tablet  Daily        02/25/22 0118             Note:  This document was prepared using Dragon voice recognition software and may include unintentional dictation errors.   Seyed Heffley, Layla Maw, DO 02/25/22 0121

## 2022-02-24 NOTE — ED Provider Triage Note (Signed)
Emergency Medicine Provider Triage Evaluation Note  Timothy Myers , a 46 y.o. male  was evaluated in triage.  Pt complains of chest pain that began a few days ago. Has had 2 MIs in the past. Feels SOB. Been constant. No aggravating or relieving factors. Non radiating.  Review of Systems  Positive: CP/SOB Negative: Back pain/abd pain  Physical Exam  There were no vitals taken for this visit. Gen:   Awake, no distress   Resp:  Normal effort  MSK:   Moves extremities without difficulty  Other:    Medical Decision Making  Medically screening exam initiated at 7:45 PM.  Appropriate orders placed.  Timothy Myers was informed that the remainder of the evaluation will be completed by another provider, this initial triage assessment does not replace that evaluation, and the importance of remaining in the ED until their evaluation is complete.     Jackelyn Hoehn, PA-C 02/24/22 1952

## 2022-02-24 NOTE — ED Triage Notes (Signed)
Pt presents to ER with c/o chest pain/tightness in the center of his chest that is constant, non-radiating and has been going on for a few days.  Pt states he has hx of 2 prior MI, with last being 2 years ago.  Pt endorses some sob, but denies dizziness or n/v.  Pt is A&O x4 at this time in NAD in triage.

## 2022-02-25 LAB — TROPONIN I (HIGH SENSITIVITY): Troponin I (High Sensitivity): 12 ng/L (ref <18)

## 2022-02-25 MED ORDER — POTASSIUM CHLORIDE CRYS ER 20 MEQ PO TBCR
40.0000 meq | EXTENDED_RELEASE_TABLET | Freq: Once | ORAL | Status: AC
  Administered 2022-02-25: 40 meq via ORAL
  Filled 2022-02-25: qty 2

## 2022-02-25 MED ORDER — ALBUTEROL SULFATE HFA 108 (90 BASE) MCG/ACT IN AERS: 2.0000 | INHALATION_SPRAY | RESPIRATORY_TRACT | 0 refills | Status: AC | PRN

## 2022-02-25 MED ORDER — ONDANSETRON 4 MG PO TBDP
4.0000 mg | ORAL_TABLET | Freq: Once | ORAL | Status: AC
  Administered 2022-02-25: 4 mg via ORAL
  Filled 2022-02-25: qty 1

## 2022-02-25 MED ORDER — PREDNISONE 20 MG PO TABS: 60.0000 mg | ORAL_TABLET | Freq: Every day | ORAL | 0 refills | Status: DC

## 2022-02-25 NOTE — ED Notes (Signed)
E-signature pad unavailable - Pt verbalized understanding of D/C information - no additional concerns at this time.  

## 2023-09-16 NOTE — Progress Notes (Signed)
 Sleep Medicine   Office Visit  Patient Name: Timothy Myers DOB: [birth year] MRN [id number]    Chief Complaint: sleep evaluation   Brief History:  Timothy Myers presents for an initial consult for sleep evaluation and to establish care. Patient has a at least history of excessive daytime sleepiness and observed apnea. Sleep quality is poor. This is noted every night. The patient's bed partner reports  snoring, gasping, choking and witnessed apneic pauses at night. The patient relates the following symptoms: headaches, fatigue, trouble concentrating and brain fog are also present. The patient goes to sleep at 1100 pm and wakes up at 0530 am and will wake up several times in between.  Sleep quality is worse when outside home environment.  Patient has noted significant movement of his legs at night that would disrupt his sleep.  The patient  relates sleep talking as unusual behavior during the night.  The patient relates no history of psychiatric problems. The Epworth Sleepiness Score is 11 out of 24 .  The patient relates  Cardiovascular risk factors include: coronary artery disease.   ROS  General: (-) fever, (-) chills, (-) night sweat Nose and Sinuses: (-) nasal stuffiness or itchiness, (-) postnasal drip, (-) nosebleeds, (-) sinus trouble. Mouth and Throat: (-) sore throat, (-) hoarseness. Neck: (-) swollen glands, (-) enlarged thyroid, (-) neck pain. Respiratory: + cough, + shortness of breath, - wheezing. Neurologic: - numbness, - tingling. Psychiatric: - anxiety, - depression Sleep behavior: -sleep paralysis -hypnogogic hallucinations -dream enactment      -vivid dreams -cataplexy -night terrors -sleep walking   Current Medication: Outpatient Encounter Medications as of 09/19/2023  Medication Sig   lisinopril (ZESTRIL) 20 MG tablet Take 20 mg by mouth.   metoprolol succinate (TOPROL-XL) 25 MG 24 hr tablet Take 25 mg by mouth daily.   albuterol (VENTOLIN HFA) 108 (90 Base) MCG/ACT  inhaler Inhale 2 puffs into the lungs every 4 (four) hours as needed for wheezing or shortness of breath.   atorvastatin (LIPITOR) 80 MG tablet TAKE ONE TABLET BY MOUTH EVERY DAY AT 6:00PM   nitroGLYCERIN (NITROSTAT) 0.4 MG SL tablet 1 TABLET UNDER TONGUE AS NEEDED FOR CHEST PAIN EVERY 5 MINUTES FOR MAX OF 3 DOSES IN 15 MINUTES;IF NO RELIEF AFTER 1ST DOSE CALL 911   [DISCONTINUED] lisinopril (ZESTRIL) 2.5 MG tablet Take 1 tablet (2.5 mg total) by mouth daily.   [DISCONTINUED] lisinopril (ZESTRIL) 5 MG tablet TAKE 1/2 TABLET (2.5MG ) BY MOUTH EVERY DAY   [DISCONTINUED] metoprolol tartrate (LOPRESSOR) 25 MG tablet TAKE ONE TABLET BY MOUTH 2 TIMES DAILY   [DISCONTINUED] nicotine (NICODERM CQ - DOSED IN MG/24 HOURS) 21 mg/24hr patch Place 1 patch (21 mg total) onto the skin daily.   [DISCONTINUED] ondansetron (ZOFRAN-ODT) 4 MG disintegrating tablet Take 1 tablet (4 mg total) by mouth every 8 (eight) hours as needed.   [DISCONTINUED] predniSONE (DELTASONE) 20 MG tablet Take 3 tablets (60 mg total) by mouth daily.   No facility-administered encounter medications on file as of 09/19/2023.    Surgical History: Past Surgical History:  Procedure Laterality Date   CORONARY/GRAFT ACUTE MI REVASCULARIZATION N/A 01/21/2017   Procedure: Coronary/Graft Acute MI Revascularization;  Surgeon: Alwyn Pea, MD;  Location: [facility] INVASIVE CV LAB;  Service: Cardiovascular;  Laterality: N/A;   CORONARY/GRAFT ACUTE MI REVASCULARIZATION N/A 07/15/2020   Procedure: Coronary/Graft Acute MI Revascularization;  Surgeon: Yvonne Kendall, MD;  Location: [facility] INVASIVE CV LAB;  Service: Cardiovascular;  Laterality: N/A;   LEFT HEART CATH AND CORONARY  ANGIOGRAPHY N/A 01/21/2017   Procedure: LEFT HEART CATH AND CORONARY ANGIOGRAPHY;  Surgeon: Alwyn Pea, MD;  Location: [facility] INVASIVE CV LAB;  Service: Cardiovascular;  Laterality: N/A;   LEFT HEART CATH AND CORONARY ANGIOGRAPHY N/A 07/15/2020   Procedure: LEFT HEART CATH AND  CORONARY ANGIOGRAPHY;  Surgeon: Yvonne Kendall, MD;  Location: [facility] INVASIVE CV LAB;  Service: Cardiovascular;  Laterality: N/A;    Medical History: Past Medical History:  Diagnosis Date   COPD (chronic obstructive pulmonary disease) (HCC)    Hypertension    MI (myocardial infarction) (HCC)     Family History: Non contributory to the present illness  Social History: Social History   Socioeconomic History   Marital status: Single    Spouse name: Not on file   Number of children: Not on file   Years of education: Not on file   Highest education level: Not on file  Occupational History   Not on file  Tobacco Use   Smoking status: Former    Current packs/day: 1.50    Average packs/day: 1.5 packs/day for 30.0 years (45.0 ttl pk-yrs)    Types: Cigarettes   Smokeless tobacco: Never  Substance and Sexual Activity   Alcohol use: No   Drug use: Yes    Types: Marijuana    Comment: Yesterday   Sexual activity: Not on file  Other Topics Concern   Not on file  Social History Narrative   Not on file   Social Drivers of Health   Financial Resource Strain: Not on file  Food Insecurity: Not on file  Transportation Needs: Not on file  Physical Activity: Not on file  Stress: Not on file  Social Connections: Not on file  Intimate Partner Violence: Not on file    Vital Signs: Blood pressure 119/79, pulse (!) 55, resp. rate 16, height 6\' 3"  (1.905 m), weight 241 lb (109.3 kg), SpO2 95%. Body mass index is 30.12 kg/m.   Examination: General Appearance: The patient is well-developed, well-nourished, and in no distress. Neck Circumference: 43 cm Skin: Gross inspection of skin unremarkable. Head: normocephalic, no gross deformities. Eyes: no gross deformities noted. ENT: ears appear grossly normal Neurologic: Alert and oriented. No involuntary movements.    STOP BANG RISK ASSESSMENT S (snore) Have you been told that you snore?     YES   T (tired) Are you often tired,  fatigued, or sleepy during the day?   YES  O (obstruction) Do you stop breathing, choke, or gasp during sleep? YES   P (pressure) Do you have or are you being treated for high blood pressure? YES   B (BMI) Is your body index greater than 35 kg/m? NO   A (age) Are you 48 years old or older? NO   N (neck) Do you have a neck circumference greater than 16 inches?   NO   G (gender) Are you a male? YES   TOTAL STOP/BANG "YES" ANSWERS 5                                                               A STOP-Bang score of 2 or less is considered low risk, and a score of 5 or more is high risk for having either moderate or severe OSA. For people who score 3  or 4, doctors may need to perform further assessment to determine how likely they are to have OSA.         EPWORTH SLEEPINESS SCALE:  Scale:  (0)= no chance of dozing; (1)= slight chance of dozing; (2)= moderate chance of dozing; (3)= high chance of dozing  Chance  Situtation    Sitting and reading: 0    Watching TV: 3    Sitting Inactive in public: 1    As a passenger in car: 1      Lying down to rest: 3    Sitting and talking: 0    Sitting quielty after lunch: 3    In a car, stopped in traffic: 0   TOTAL SCORE:   11 out of 24    SLEEP STUDIES:  None   LABS: No results found for this or any previous visit (from the past 2160 hours).  Radiology: US ABDOMEN LIMITED RUQ (LIVER/GB) Result Date: 02/25/2022 CLINICAL DATA:  Elevated LFTs. EXAM: ULTRASOUND ABDOMEN LIMITED RIGHT UPPER QUADRANT COMPARISON:  None Available. FINDINGS: Gallbladder: No gallstones or wall thickening visualized. No sonographic Murphy sign noted by sonographer. Common bile duct: Diameter: 4 mm Liver: No focal lesion identified. Within normal limits in parenchymal echogenicity. Portal vein is patent on color Doppler imaging with normal direction of blood flow towards the liver. Other: None. IMPRESSION: Unremarkable right upper quadrant ultrasound.  Electronically Signed   By: Elgie Collard M.D.   On: 02/25/2022 00:08   DG Chest 2 View Result Date: 02/24/2022 CLINICAL DATA:  CP EXAM: CHEST - 2 VIEW COMPARISON:  Chest x-ray 02/19/2021 FINDINGS: The heart and mediastinal contours are unchanged. No focal consolidation. No pulmonary edema. No pleural effusion. No pneumothorax. No acute osseous abnormality. IMPRESSION: No active cardiopulmonary disease. Electronically Signed   By: Tish Frederickson M.D.   On: 02/24/2022 20:11    No results found.  No results found.    Assessment and Plan: Patient Active Problem List   Diagnosis Date Noted   Witnessed episode of apnea 09/19/2023   STEMI (ST elevation myocardial infarction) (HCC) 07/15/2020   CAD (coronary artery disease) 07/15/2020   ST elevation myocardial infarction involving left circumflex coronary artery (HCC) 01/21/2017   1. Witnessed episode of apnea (Primary)  Patient evaluation suggests high risk of sleep disordered breathing due to witnessed apnea, snoring, gasping, choking.  Patient has comorbid cardiovascular risk factors including: coronary artery disease which could be exacerbated by pathologic sleep-disordered breathing.  Suggest: Home sleep study  to assess/treat the patient's sleep disordered breathing. The patient was also counselled on weight loss to optimize sleep health.  2. Coronary artery disease Asymptomatic. Continue with followup with cardiology.   General Counseling: I have discussed the findings of the evaluation and examination with Timothy Myers.  I have also discussed any further diagnostic evaluation thatmay be needed or ordered today. Timothy Myers verbalizes understanding of the findings of todays visit. We also reviewed his medications today and discussed drug interactions and side effects including but not limited excessive drowsiness and altered mental states. We also discussed that there is always a risk not just to him but also people around him. Timothy Myers has been  encouraged to call the office with any questions or concerns that should arise related to todays visit.  No orders of the defined types were placed in this encounter.       I have personally obtained a history, evaluated the patient, evaluated pertinent data, formulated the assessment and plan and placed orders. This  patient was seen today by Emmaline Kluver, PA-C in collaboration with Dr. Freda Munro.     Yevonne Pax, MD [facility] Diplomate ABMS Pulmonary and Critical Care Medicine Sleep medicine

## 2023-09-19 ENCOUNTER — Ambulatory Visit: Payer: Self-pay | Admitting: Internal Medicine

## 2023-09-19 VITALS — BP 119/79 | HR 55 | Resp 16 | Ht 75.0 in | Wt 241.0 lb

## 2023-09-19 DIAGNOSIS — I2510 Atherosclerotic heart disease of native coronary artery without angina pectoris: Secondary | ICD-10-CM

## 2023-09-19 DIAGNOSIS — R0681 Apnea, not elsewhere classified: Secondary | ICD-10-CM
